# Patient Record
Sex: Male | Born: 1965 | Race: White | Hispanic: No | Marital: Married | Smoking: Never smoker
Health system: Southern US, Community
[De-identification: ages and names within clinical notes are randomized; demographics above are authoritative.]

## PROBLEM LIST (undated history)

## (undated) DIAGNOSIS — R21 Rash and other nonspecific skin eruption: Secondary | ICD-10-CM

## (undated) DIAGNOSIS — F909 Attention-deficit hyperactivity disorder, unspecified type: Secondary | ICD-10-CM

## (undated) DIAGNOSIS — J45909 Unspecified asthma, uncomplicated: Secondary | ICD-10-CM

## (undated) DIAGNOSIS — M5136 Other intervertebral disc degeneration, lumbar region: Secondary | ICD-10-CM

## (undated) DIAGNOSIS — E291 Testicular hypofunction: Secondary | ICD-10-CM

## (undated) DIAGNOSIS — F419 Anxiety disorder, unspecified: Secondary | ICD-10-CM

## (undated) DIAGNOSIS — M51369 Other intervertebral disc degeneration, lumbar region without mention of lumbar back pain or lower extremity pain: Secondary | ICD-10-CM

## (undated) DIAGNOSIS — A4902 Methicillin resistant Staphylococcus aureus infection, unspecified site: Secondary | ICD-10-CM

## (undated) DIAGNOSIS — K219 Gastro-esophageal reflux disease without esophagitis: Secondary | ICD-10-CM

## (undated) DIAGNOSIS — J302 Other seasonal allergic rhinitis: Secondary | ICD-10-CM

## (undated) HISTORY — PX: CARPAL TUNNEL RELEASE: SHX101

## (undated) HISTORY — PX: ULNAR TUNNEL RELEASE: SHX820

## (undated) HISTORY — DX: Testicular hypofunction: E29.1

## (undated) HISTORY — DX: Other seasonal allergic rhinitis: J30.2

## (undated) HISTORY — DX: Anxiety disorder, unspecified: F41.9

## (undated) HISTORY — DX: Methicillin resistant Staphylococcus aureus infection, unspecified site: A49.02

## (undated) HISTORY — DX: Unspecified asthma, uncomplicated: J45.909

## (undated) HISTORY — DX: Other intervertebral disc degeneration, lumbar region: M51.36

## (undated) HISTORY — DX: Rash and other nonspecific skin eruption: R21

## (undated) HISTORY — DX: Other intervertebral disc degeneration, lumbar region without mention of lumbar back pain or lower extremity pain: M51.369

## (undated) HISTORY — DX: Attention-deficit hyperactivity disorder, unspecified type: F90.9

## (undated) HISTORY — DX: Gastro-esophageal reflux disease without esophagitis: K21.9

---

## 2003-08-06 ENCOUNTER — Encounter: Admission: RE | Admit: 2003-08-06 | Discharge: 2003-08-06 | Payer: Self-pay | Admitting: Orthopedic Surgery

## 2005-07-29 ENCOUNTER — Encounter: Admission: RE | Admit: 2005-07-29 | Discharge: 2005-07-29 | Payer: Self-pay | Admitting: Orthopedic Surgery

## 2006-07-12 ENCOUNTER — Inpatient Hospital Stay (HOSPITAL_COMMUNITY): Admission: EM | Admit: 2006-07-12 | Discharge: 2006-07-24 | Payer: Self-pay | Admitting: Orthopedic Surgery

## 2006-07-16 ENCOUNTER — Ambulatory Visit: Payer: Self-pay | Admitting: Internal Medicine

## 2006-08-20 ENCOUNTER — Ambulatory Visit: Payer: Self-pay | Admitting: Infectious Diseases

## 2006-12-12 ENCOUNTER — Telehealth: Payer: Self-pay | Admitting: Infectious Diseases

## 2006-12-13 DIAGNOSIS — Z8739 Personal history of other diseases of the musculoskeletal system and connective tissue: Secondary | ICD-10-CM | POA: Insufficient documentation

## 2007-02-23 ENCOUNTER — Emergency Department (HOSPITAL_COMMUNITY): Admission: EM | Admit: 2007-02-23 | Discharge: 2007-02-23 | Payer: Self-pay | Admitting: Emergency Medicine

## 2007-05-23 ENCOUNTER — Telehealth: Payer: Self-pay

## 2009-04-13 IMAGING — CR DG FEMUR 2V*L*
4 series · 4 of 4 positions shown · non-contrast
Comparison: none

CLINICAL DATA: Left thigh laceration secondary to chain saw injury.
 LEFT FEMUR - 4 VIEW:

[view not recorded (1 of 4)]
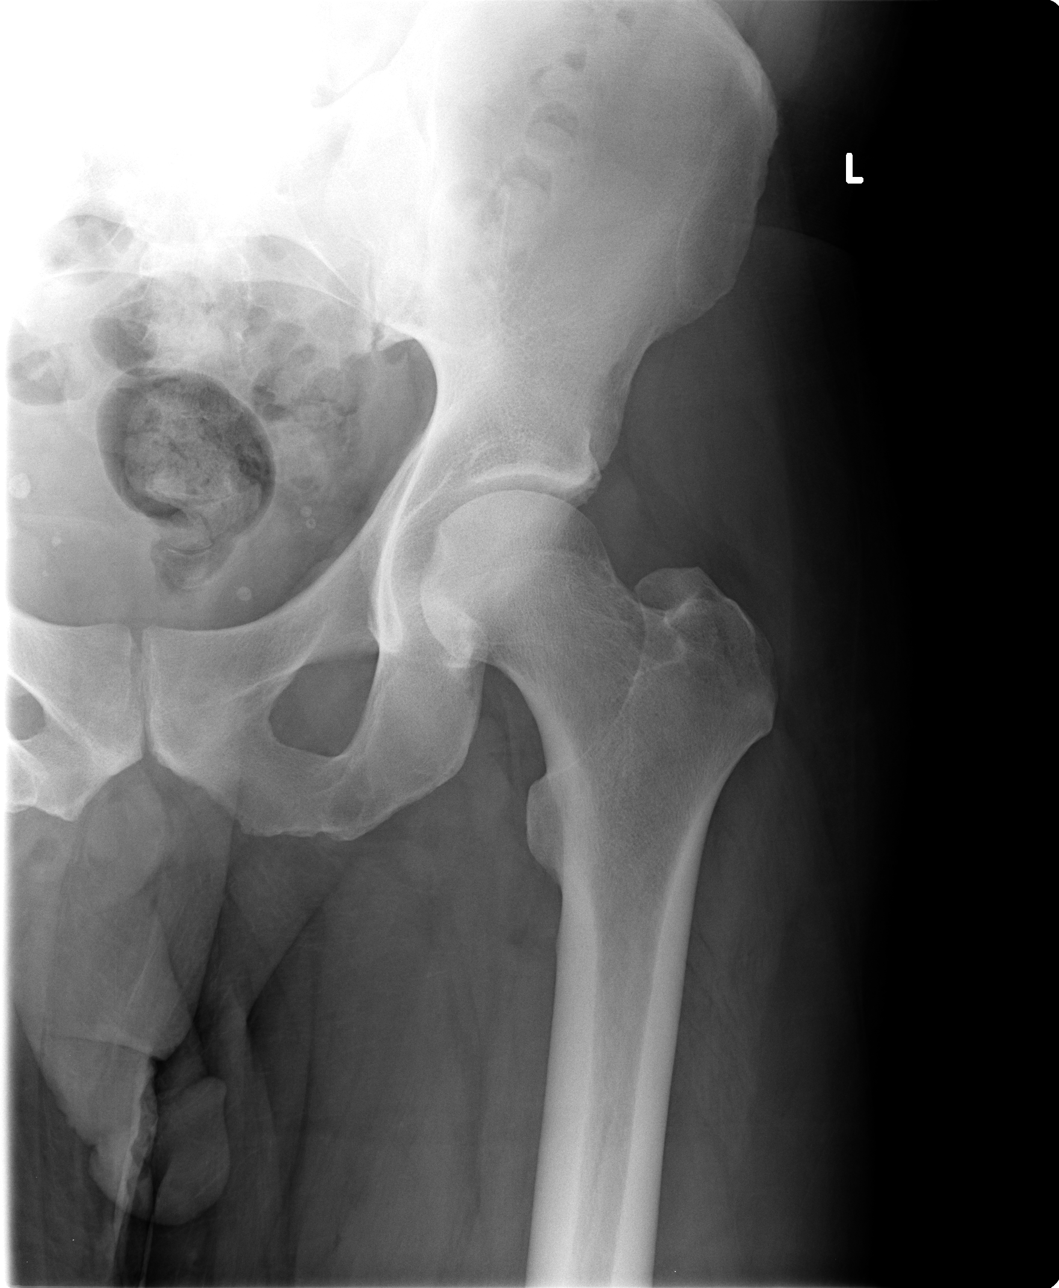

[view not recorded (2 of 4)]
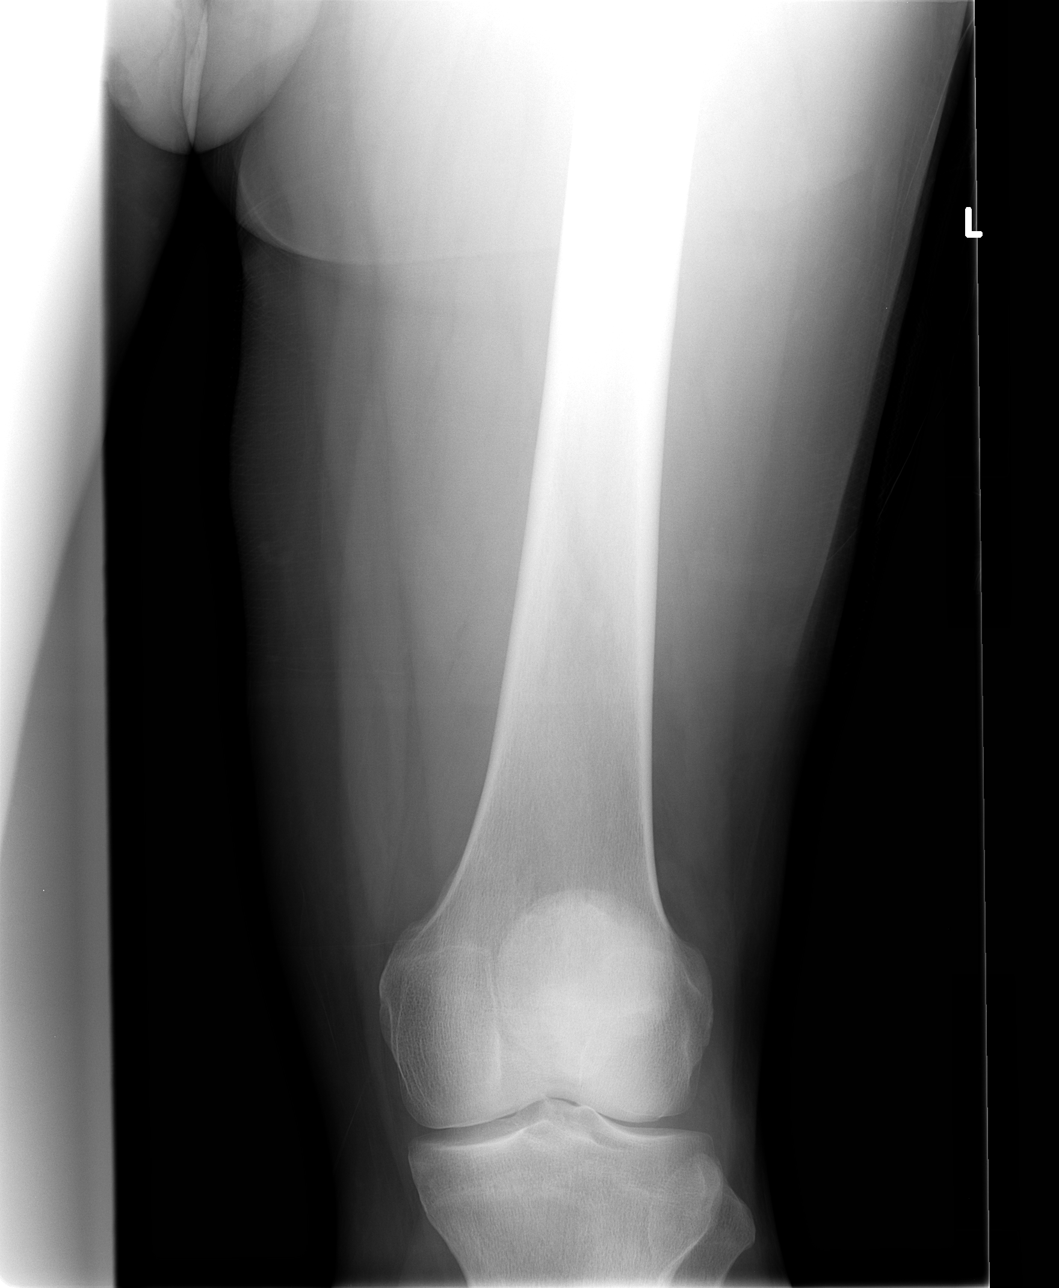

[view not recorded (3 of 4)]
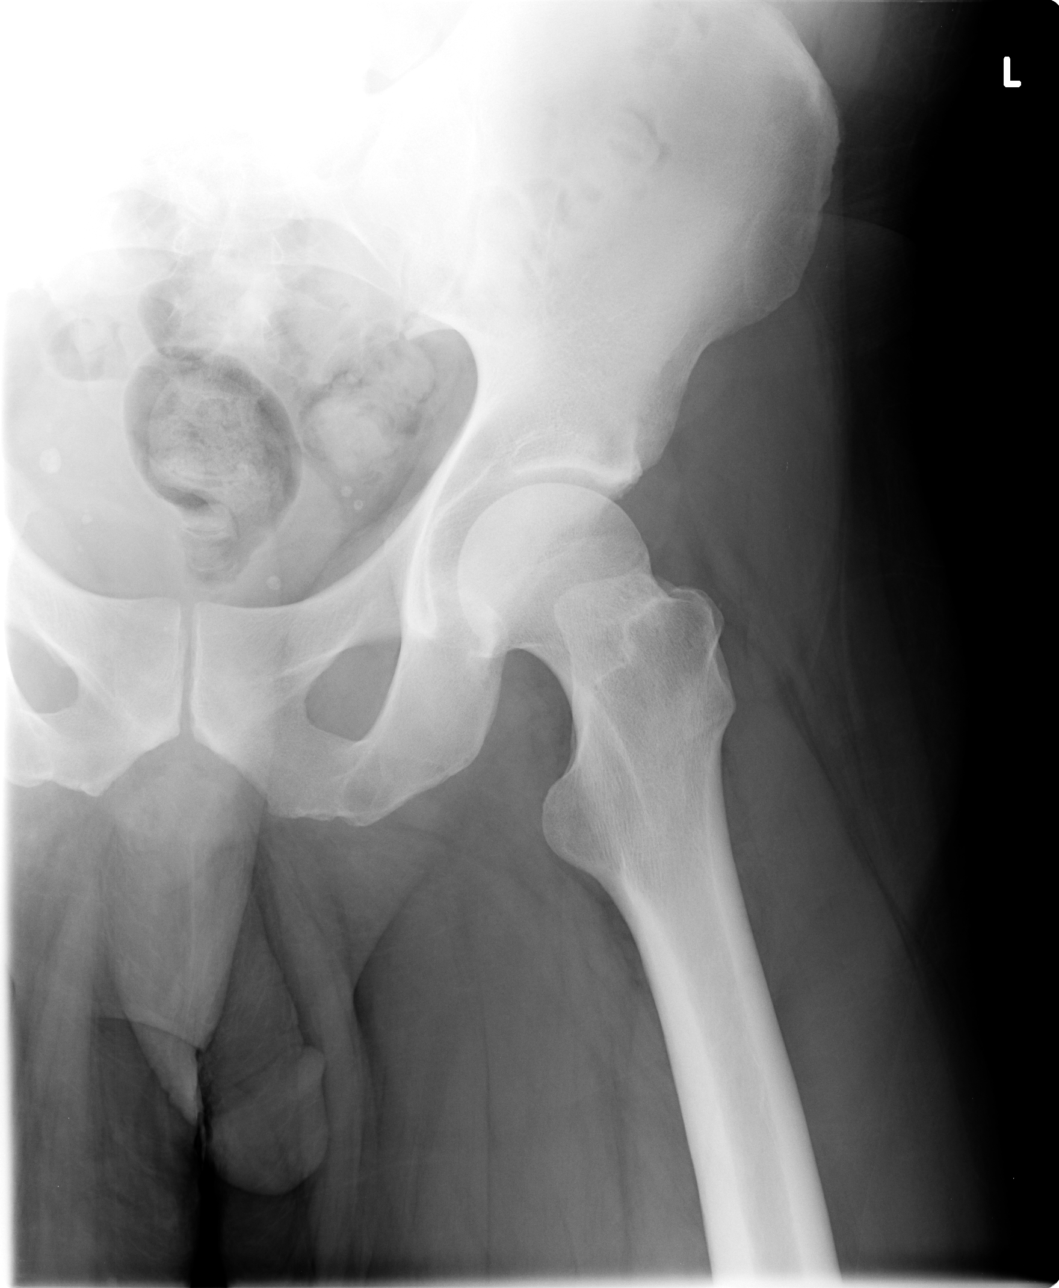

[view not recorded (4 of 4)]
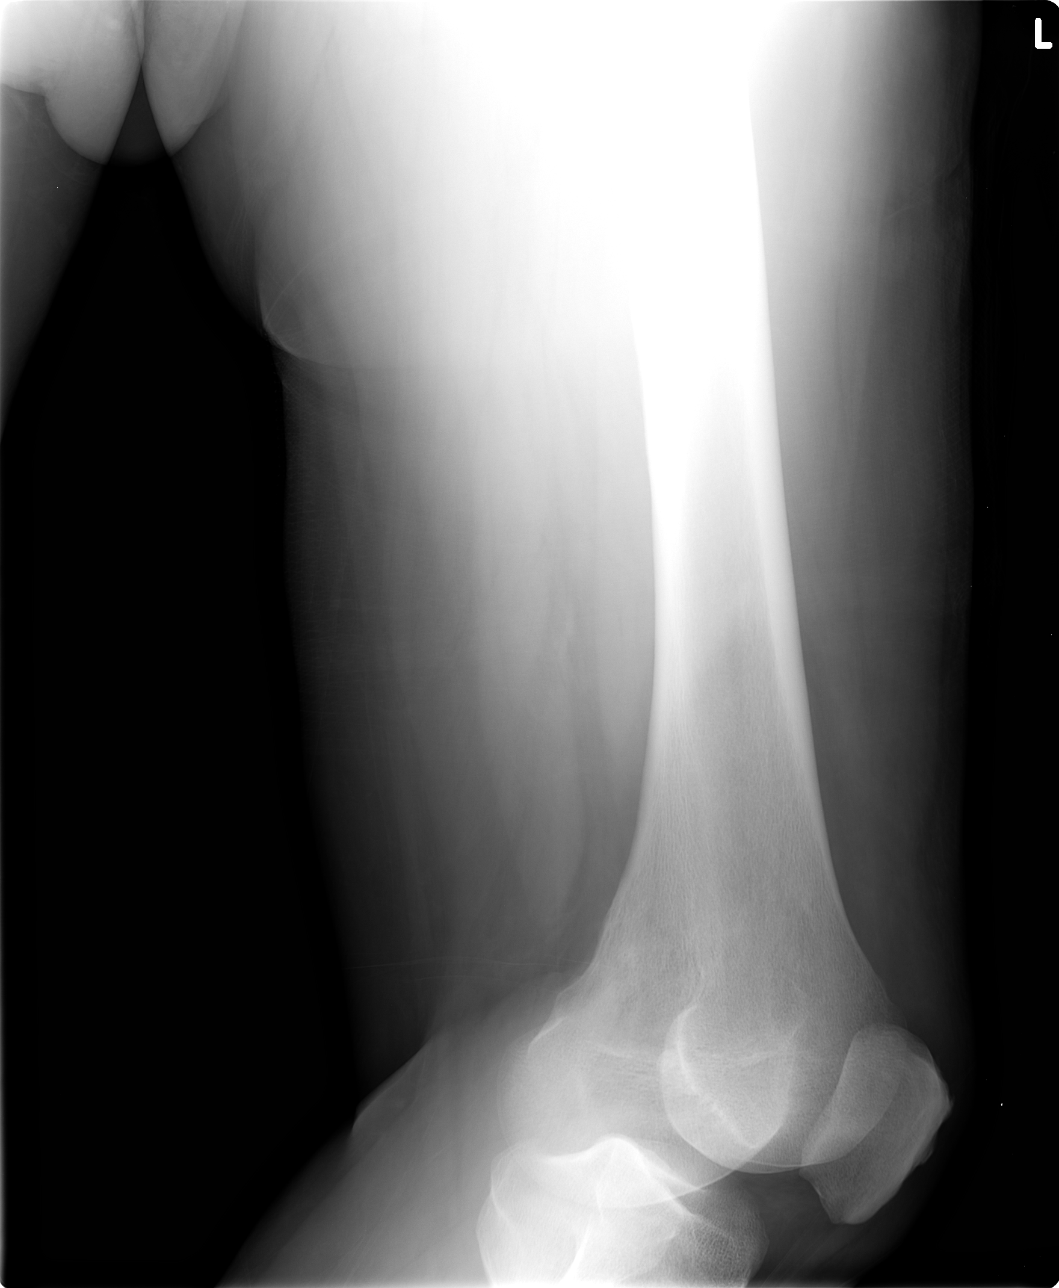

[4 of 4 positions shown; findings below may reference images not displayed]

FINDINGS: No acute bony abnormality. No radiopaque foreign body.
IMPRESSION: No acute bony abnormality.

## 2009-06-14 DIAGNOSIS — A4902 Methicillin resistant Staphylococcus aureus infection, unspecified site: Secondary | ICD-10-CM

## 2009-06-14 HISTORY — PX: PERIPHERALLY INSERTED CENTRAL CATHETER INSERTION: SHX2221

## 2009-06-14 HISTORY — DX: Methicillin resistant Staphylococcus aureus infection, unspecified site: A49.02

## 2010-09-03 ENCOUNTER — Encounter: Payer: Self-pay | Admitting: Orthopedic Surgery

## 2010-12-30 NOTE — Discharge Summary (Signed)
NAMEBRISTOL, SOY NO.:  [id number]   MEDICAL RECORD NO.:  [id number]          PATIENT TYPE:  INP   LOCATION:  1611                         FACILITY:  [facility]   PHYSICIAN:  Marlowe Kays, M.D.  DATE OF BIRTH:  1965/09/23   DATE OF ADMISSION:  07/12/2006  DATE OF DISCHARGE:  07/24/2006                               DISCHARGE SUMMARY   ADMITTING DIAGNOSES:  Cellulitis and prepatellar bursitis, right knee.   DISCHARGE DIAGNOSIS:  Cellulitis and prepatellar bursitis, right knee.   CONSULT:  Dr. Johny Sax, infectious disease.   OPERATION:  None.   PROCEDURE:  1. Aspiration of right prepatellar bursitis with cultures at bedside.  2. Insertion of PICC line for home antibiotic therapy.   BRIEF HISTORY:  This 45 year old white male presented to the office in  the courtesy of referral by Dr. Jeannie Fend of [facility] in [place].  The patient gives a  history of working on his knees on and off with blunt knee trauma.  Also  recalls a situation where he was working on a washing machine that had  raw sewage backed up into it, and this spilled on his right lower  extremity.  Also through Dr. Tinnie Gens Hatcher's close questioning, he did  have high suspicion of a spider bite to his right foot in the not too  distant past as he found a spider in his boot.   When patient presented to the office, he had a very angry prepatellar  bursitis to his right knee with surrounding cellulitis to the anterior  knee.  This was exquisitely tender to palpation and touch and interfered  with flexion and extension of the knee.  ____________ was about 20  degrees.  He was able to get about, but the lack of range of motion in  the knee secondary to pain caused a limp to the right.   In the office, the prepatellar bursa was aspirated.  The joint was not  entered.  These were sent for cultures and showed a negative gram stain  as  well as no growth.  The patient returned after being on Keflex p.o.  q.i.d.  He was noted to have continuing erythema and increased pain.  It  was decided at that time that hospitalization with IV antibiotic therapy  would be indicated as well as rest and constant heat.  He therefore was  admitted to [facility].   COURSE IN THE HOSPITAL:  The patient was placed at bedrest.  IV Ancef  was given early on which caused about a 30% improvement to the  cellulitis as well as the pain.  It was hoped that this may be on the  right course.  Unfortunately, his pain increased.  We asked for  infectious disease consult by Dr. Tinnie Gens C. Hatcher, and he recommended  that we change over to vancomycin.  It was highly suspect that this was  indicative of a MRSA-type infection, particularly with history of MRSA  in the hospital.  We had extreme difficulty with pain control.  He was  on and off of PCA Dilaudid for pain as well as p.o..  As he responded to  the vancomycin, his pain decreased, and we eventually were able to get  him on p.o. analgesics.   The erythema resolved somewhat around the anterior knee with the primary  area being in the patellar region.  Continued with warmth and  tenderness, with overall the cellulitis was better.   Aspiration was done at bedside with reculturing of the patella bursa.  This again showed negative on gram stain, no epithelial cells were seen  and negative for any growth after two days.  Dr. Ninetta Lights felt it was  imperative that we continue with the patient's antibiotic therapy,  particularly in light of the fact of the suspect of a prepatellar MRSA  infection.  Arrangements were made to [facility] for him to have IV  vancomycin 2 g b.i.d. for 2 weeks.  PICC line was arranged for him to  have home IV antibiotic therapy.  Hopefully, we will be able to maintain  this gentleman in his home with high concern for pain control at [facility]  time.  Dr. Sedonia Small and I  have discussed at bedside the medication and  plan to the patient in detail.  Laboratory values in the hospital  hematologically showed admission CBC with an elevated white count of  10.3, hemoglobin was 12.7, hematocrit 36.1.  Urinalysis was negative for  urinary tract infection.  The blood chemistries were all normal.  Vancomycin trough was 18.9 on July 18, 2006.  Cultures done on  July 18, 2006 from the right prepatellar showed no WBCs, no squamous  epithelial cells seen, no organisms seen and no growth x2 days with the  final indication.  No x-rays were taken.  Electrocardiogram was not  done.   CONDITION ON DISCHARGE:  Improved, stable.   PLAN:  The patient discharged to his home to continue with IV antibiotic  therapy using vancomycin 2 g b.i.d. under the direction of [facility].  He will need vancomycin troughs per protocol.  For pain,  we will give him Dilaudid 2 mg 1 to 2 q.4-6h. p.r.n. pain.  Hopefully,  we can come down to medication that is not quite as strong in the not  too distant future.  Also,  as an anti-inflammatory, Mobic 7.5 one  b.i.d. p.r.n. is given.  Vancomycin 2 g b.i.d. for 2 weeks IV as  mentioned and an 81-mg aspirin tablet daily.  I have also ordered a K-  pad for continuous moist heat to the right knee.  He may weight bear as  tolerated.  Use dry dressing to the knee as needed.  There is a small  amount of drainage at the time of discharge to the 4x4, and we have  recommend to keep it covered.  He is encouraged to call should he have  any problems or questions.  We will also have him follow-up in the  office in 1 week.  We will check him at least weekly.  All questions  were encouraged and answered at the time of discharge, and he is  encouraged to call us should he have any problems or questions.      Dooley L. Cherlynn June.    ______________________________  Marlowe Kays, M.D.   DLU/MEDQ  D:  07/24/2006  T:  07/24/2006   Job:  [id number]   cc:   Lacretia Leigh. Ninetta Lights, M.D.  Fax: [phone]   Paul Dykes. Grant Ruts.,  M.D.  Fax: [phone]   [facility]

## 2010-12-30 NOTE — H&P (Signed)
NAME:  Colin Sutton, Colin Sutton NO.:  [id number]   MEDICAL RECORD NO.:  [id number]          PATIENT TYPE:  INP   LOCATION:  1611                         FACILITY:  [facility]   PHYSICIAN:  Marlowe Kays, M.D.  DATE OF BIRTH:  1966-03-21   DATE OF ADMISSION:  07/12/2006  DATE OF DISCHARGE:                              HISTORY & PHYSICAL   CHIEF COMPLAINT:  Pain in my right knee.   PRESENT ILLNESS:  This is a 45 year old white male seen by [place] for  problems concerning his right knee.  He was seen early on in the office  with prepatellar bursitis with pain and swelling into the prepatellar  region of the right knee.  The patient stated he has had multiple  problems with the knee with kneeling and with doing work on his knees.  He is a Restaurant manager, fast food and there is considerable amount of pressure to his  knee.  Also had exposure to raw sewage and septic drainage when working  with a washing machine that had sewage backup into it.  Was seen in the  office, the knee was aspirated and cultured.  No growth and negative  gram stain was seen.  There was some calcium phosphate crystals.  He was  seen again with increased pain, unbearable to the point where p.o.  analgesia was not helping his discomfort.  He had erythema about the  entire anterior knee, swelling, exquisite tenderness to touch.  Calf was  soft and Homans sign was negative.  The patient was to be admitted to  [facility] for IV antibiotic therapy and pain control.  No  surgery is planned at this time.   PAST MEDICAL HISTORY:  This gentleman has been in relatively good health  throughout his lifetime.   Present illness is his only major problem.  He does have bilateral  plantar fasciitis, takes Percocet for that which has helped his overall  ability to get up and about.  He had been seen in the past by Dr.  Lestine Box for that.   FAMILY HISTORY AND SOCIAL HISTORY:  Noncontributory.   CURRENT MEDICATIONS:  1.  Toradol 10 mg one tablet q.6 h p.r.n. pain.  2. Cephalexin 5 mg p.o. q.i.d. (see started the office after first      office visit).   REVIEW OF SYSTEMS:  CNS: No seizures or paralysis, numbness, double  vision.  RESPIRATORY:  No productive cough, no hemoptysis, shortness of  breath.  CARDIOVASCULAR:  No chest pain or angina, orthopnea.  GASTROINTESTINAL:  No nausea, vomiting, melena or bloody stools.  GENITOURINARY:  No discharge, dysuria.  MUSCULOSKELETAL:  Primarily as  in present illness his right knee, but also bilateral plantar fasciitis,  worse in the morning.   PHYSICAL EXAMINATION:  GENERAL:  He is an alert, cooperative, obese 63-  year-old white male who is in obvious distress.  He walks with a guarded  limp to the right.  Range of motion of right knee is markedly guarded.  HEENT:  Normocephalic.  PERRLA.  Extraocular movements intact.  Oropharynx clear.  CHEST:  Clear to  auscultation, rhonchi or rales or wheezes.  HEART:  Regular rate and rhythm.  No murmurs are heard.  ABDOMEN: Soft, nontender, liver and spleen not felt.  CARDIOVASCULAR:  Not done.  GENITALIA/RECTAL:  Not done.  Not pertinent to present illness.  EXTREMITIES:  Right knee as in present illness above.   ADMISSION DIAGNOSIS:  Cellulitis and prepatellar bursitis of the right  knee, possible infection.   PLAN:  The patient will undergo IV antibiotic therapy and pain control  with continuous heat and rest to the right knee during this  hospitalization.  Even in light of the negative cultures, this  presentation does suggest a MRSA type infection, and we will certainly  be looking at this very, very closely to see how it does responds to the  IV Ancef.      Dooley L. Cherlynn June.    ______________________________  Marlowe Kays, M.D.    DLU/MEDQ  D:  07/24/2006  T:  07/24/2006  Job:  [id number]   cc:   Remi Deter A. Grant Ruts., M.D.  Fax: [phone]   Marlowe Kays, M.D.  Fax: [phone]

## 2013-03-14 DIAGNOSIS — E291 Testicular hypofunction: Secondary | ICD-10-CM

## 2013-03-14 HISTORY — DX: Testicular hypofunction: E29.1

## 2013-03-26 ENCOUNTER — Encounter: Payer: Self-pay | Admitting: Medical

## 2013-03-26 ENCOUNTER — Ambulatory Visit
Admission: RE | Admit: 2013-03-26 | Discharge: 2013-03-26 | Disposition: A | Discharge status: Home / Self Care | Payer: BC Managed Care – PPO | Source: Ambulatory Visit | Attending: Medical | Admitting: Medical

## 2013-03-26 ENCOUNTER — Ambulatory Visit (INDEPENDENT_AMBULATORY_CARE_PROVIDER_SITE_OTHER): Payer: BC Managed Care – PPO | Admitting: Medical

## 2013-03-26 VITALS — BP 102/80 | HR 76 | Temp 97.4°F | Resp 16 | Ht 74.0 in | Wt 266.0 lb

## 2013-03-26 DIAGNOSIS — R5383 Other fatigue: Secondary | ICD-10-CM

## 2013-03-26 DIAGNOSIS — K036 Deposits [accretions] on teeth: Secondary | ICD-10-CM

## 2013-03-26 DIAGNOSIS — R0602 Shortness of breath: Secondary | ICD-10-CM

## 2013-03-26 DIAGNOSIS — Z23 Encounter for immunization: Secondary | ICD-10-CM

## 2013-03-26 DIAGNOSIS — Z569 Unspecified problems related to employment: Secondary | ICD-10-CM

## 2013-03-26 DIAGNOSIS — R6882 Decreased libido: Secondary | ICD-10-CM

## 2013-03-26 DIAGNOSIS — M25569 Pain in unspecified knee: Secondary | ICD-10-CM

## 2013-03-26 DIAGNOSIS — R112 Nausea with vomiting, unspecified: Secondary | ICD-10-CM

## 2013-03-26 DIAGNOSIS — R5381 Other malaise: Secondary | ICD-10-CM

## 2013-03-26 DIAGNOSIS — J45909 Unspecified asthma, uncomplicated: Secondary | ICD-10-CM

## 2013-03-26 DIAGNOSIS — M7652 Patellar tendinitis, left knee: Secondary | ICD-10-CM

## 2013-03-26 DIAGNOSIS — Z0000 Encounter for general adult medical examination without abnormal findings: Secondary | ICD-10-CM

## 2013-03-26 DIAGNOSIS — M7650 Patellar tendinitis, unspecified knee: Secondary | ICD-10-CM

## 2013-03-26 DIAGNOSIS — G8929 Other chronic pain: Secondary | ICD-10-CM

## 2013-03-26 DIAGNOSIS — Z579 Occupational exposure to unspecified risk factor: Secondary | ICD-10-CM

## 2013-03-26 DIAGNOSIS — R21 Rash and other nonspecific skin eruption: Secondary | ICD-10-CM

## 2013-03-26 LAB — POCT URINALYSIS DIPSTICK
Bilirubin, UA: NEGATIVE
Blood, UA: NEGATIVE
Glucose, UA: NEGATIVE
Ketones, UA: NEGATIVE
Leukocytes, UA: NEGATIVE
Nitrite, UA: NEGATIVE
Protein, UA: NEGATIVE
Spec Grav, UA: 1.02
Urobilinogen, UA: NEGATIVE
pH, UA: 5

## 2013-03-26 MED ORDER — DICLOFENAC SODIUM 1.5 % TD SOLN: 40.0000 [drp] | Freq: Two times a day (BID) | TRANSDERMAL | Status: DC

## 2013-03-26 NOTE — Progress Notes (Signed)
Subjective:   HPI  Colin Sutton is a 47 y.o. male who presents for a complete physical.  No prior routine primary care.   Medical care team includes:  Dr. Ethelene Hal, orthopedics  Dr. Evelene Croon, psychiatry   Preventative care: Last ophthalmology visit: n/a Last dental visit: n/a Last colonoscopy:n/a Last prostate exam: ? Last EKG years ago Last labs:n/a Ortho doctor- Dr. Ethelene Hal  Prior vaccinations: TD or Tdap:unsure Influenza:n/a Pneumococcal:n/a Shingles/Zostavax:n/a  Advanced directive:n/a Health care power of attorney:n/a Living will:n/a  Concerns: He notes hx/o asthma.   He is a nonsmoker.  From time to time uses mother's Atrovent.  Gets SOB and "asthma" 2-3 times each month.  He works as a Writer, cuts stone, doesn't use mask.     He notes rash, intermittent for year.  Thinks its psoriasis, gets red patches, scaling patches, mostly left ankle.  Left knee at times crunches and causes pain.  Dr. Ethelene Hal tried NSAID oral, and wrote script for Pennsaid topically, but insurance company denied, unless he was diagnosed with bursitis.  He is on his knees laying stone, does a lot of physical activity on the job.  He reports hx/o vomiting x 3 years.  Was given script for phenergan by Dr. Ethelene Hal which helped.   Once weekly gets this.  Is out in the heat doing work often, does drink a lot of water.   At times gets nausea, sometimes episodes of vomiting.   Has had persistent vomiting and nausea at least once weekly the last 3 years or more.   He notes lack of energy, fatigued, low sex drive at times in the past.   Used some of father's testim which made him feel like a teenager.   Denies problems with erections.    Reviewed their medical, surgical, family, social, medication, and allergy history and updated chart as appropriate.  Past Medical History  Diagnosis Date  . Asthma     has used mother's Atrovent prn  . Seasonal allergic rhinitis   . GERD (gastroesophageal reflux disease)    . Rash     intermittent  . MRSA infection 06/2009    [facility], 28 day hospitalization, started with abrasion to knee  . ADHD (attention deficit hyperactivity disorder)     Dr. Evelene Croon  . Anxiety   . Lumbar degenerative disc disease     Dr. Ethelene Hal, [facility]    Past Surgical History  Procedure Laterality Date  . Carpal tunnel release      bilat  . Ulnar tunnel release      right arm  . Peripherally inserted central catheter insertion  06/2009    Vancomycin for MRSA infection     History   Social History  . Marital Status: Married    Spouse Name: N/A    Number of Children: N/A  . Years of Education: N/A   Occupational History  . Not on file.   Social History Main Topics  . Smoking status: Never Smoker   . Smokeless tobacco: Not on file  . Alcohol Use: No  . Drug Use: No  . Sexual Activity: Not on file   Other Topics Concern  . 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   Single.   Has 2 chlidren.  Christian faith.     Family History  Problem Relation Age of Onset  . COPD Mother   . Diabetes Mother   .  Heart disease Father 38  . Diabetes Father   . Asthma Brother   . Cancer Neg Hx   . Stroke Neg Hx     Current outpatient prescriptions:ALPRAZolam (XANAX) 1 MG tablet, Take 1 mg by mouth 4 (four) times daily., Disp: , Rfl: ;  dextroamphetamine (DEXEDRINE SPANSULE) 10 MG 24 hr capsule, Take 10 mg by mouth 6 (six) times daily., Disp: , Rfl: ;  mirtazapine (REMERON) 15 MG tablet, Take 15 mg by mouth at bedtime., Disp: , Rfl:  oxyCODONE (ROXICODONE) 15 MG immediate release tablet, Take 15 mg by mouth every 4 (four) hours as needed for pain., Disp: , Rfl:   Allergies  Allergen Reactions  . Acetaminophen     GI upset, diarrhea  . Morphine And Related     Sick, dizzy, nausea, vomiting   Review of Systems Constitutional: -fever, -chills, -sweats, -unexpected weight change, -decreased  appetite, -fatigue Allergy: -sneezing, -itching, -congestion Dermatology: -changing moles, +rash, -lumps ENT: -runny nose, -ear pain, -sore throat, -hoarseness, -sinus pain, -teeth pain, - ringing in ears, -hearing loss, -nosebleeds Cardiology: -chest pain, -palpitations, -swelling, -difficulty breathing when lying flat, -waking up short of breath Respiratory: -cough, -shortness of breath, -difficulty breathing with exercise or exertion, -wheezing, -coughing up blood Gastroenterology: -abdominal pain, -nausea, +vomiting, -diarrhea, -constipation, -blood in stool, -changes in bowel movement, -difficulty swallowing or eating Hematology: -bleeding, -bruising  Musculoskeletal: -joint aches, -muscle aches, -joint swelling, -back pain, -neck pain, -cramping, -changes in gait Ophthalmology: denies vision changes, eye redness, itching, discharge Urology: -burning with urination, -difficulty urinating, -blood in urine, -urinary frequency, -urgency, -incontinence Neurology: -headache, -weakness, -tingling, -numbness, -memory loss, -falls, -dizziness Psychology: -depressed mood, -agitation, -sleep problems     Objective:   Physical Exam  Filed Vitals:   03/26/13 0931  BP: 102/80  Pulse: 76  Temp: 97.4 F (36.3 C)  Resp: 16    General appearance: alert, no distress, WD/WN, overweight white male Skin: left upper anterior thigh with horizontal scar, right forehead above eyebrow with vertical scar, few scattered macules, right lateral buttock with large pedunculated skin tag, no worrisome lesions HEENT: normocephalic, conjunctiva/corneas normal, sclerae anicteric, PERRLA, EOMi, nares patent, no discharge or erythema, pharynx normal Oral cavity: MMM, tongue normal, teeth with significant plaque Neck: supple, no lymphadenopathy, no thyromegaly, no masses, normal ROM, no bruits Chest: non tender, normal shape and expansion Heart: RRR, normal S1, S2, no murmurs Lungs: CTA bilaterally, no wheezes,  rhonchi, or rales Abdomen: +bs, soft, non tender, non distended, no masses, no hepatomegaly, no splenomegaly, no bruits Back: non tender, normal ROM, no scoliosis Musculoskeletal: bilat knees with crepitus, left patellar tendon tender, tender medial knee of left leg, mild pain with mcmurays, but no clicks, no laxity, otherwise upper extremities non tender, no obvious deformity, normal ROM throughout, lower extremities non tender, no obvious deformity, normal ROM throughout Extremities: no edema, no cyanosis, no clubbing Pulses: 2+ symmetric, upper and lower extremities, normal cap refill Neurological: alert, oriented x 3, CN2-12 intact, strength normal upper extremities and lower extremities, sensation normal throughout, DTRs 2+ throughout, no cerebellar signs, gait normal Psychiatric: normal affect, behavior normal, pleasant  GU: normal male external genitalia, uncircumcised, bilat varicoceles, nontender, no masses, no hernia, no lymphadenopathy Rectal: anus normal tone, prostate WNL, no nodules, occult negative stool   Assessment and Plan :      Encounter Diagnoses  Name Primary?  . Routine general medical examination at a health care facility Yes  . Unspecified asthma(493.90)   . SOB (shortness of breath)   .  Occupational exposure in workplace   . Other malaise and fatigue   . Rash and nonspecific skin eruption   . Chronic knee pain, left   . Patellar tendonitis, left   . Nausea with vomiting   . Libido, decreased   . Dental plaque   . Need for Tdap vaccination    Physical exam - discussed healthy lifestyle, diet, exercise, preventative care, vaccinations, and addressed their concerns.  Tdap vaccine, VIS and counseling given.  Specific recommendations today include:  See dentist soon for moderate plaque and general dental evaluation  See eye doctor for routine screening for glaucoma and vision and retina problems  Asthma/shortness of breath - we will refer you for chest xray  and pulmonary function studies for further evaluation  We are checking your male hormone level as well as other tests in regards to your fatigue and persistent nausea  Pending labs, I will likely refer you to gastroenterology given the persistent nausea, vomiting, and history of GERD  We updated your Tdap today, this vaccine is good for 10 years  Knee pain - seems to be a combination of patellar tendonitis, patellofemoral syndrome and likely some inflammation.  Use daily Glucosamine Chondroitin OTC, and begin Pennsaid topically twice daily for relief.  Follow up: pending labs, studies

## 2013-03-26 NOTE — Patient Instructions (Addendum)
Thank you for giving me the opportunity to serve you today.    Your diagnosis today includes: Encounter Diagnoses  Name Primary?  . Routine general medical examination at a health care facility Yes  . Unspecified asthma(493.90)   . SOB (shortness of breath)   . Occupational exposure in workplace   . Other malaise and fatigue   . Rash and nonspecific skin eruption   . Chronic knee pain, left   . Patellar tendonitis, left   . Nausea with vomiting   . Libido, decreased   . Dental plaque   . Need for Tdap vaccination      Specific recommendations today include:  See dentist soon for moderate plaque and general dental evaluation  See eye doctor for routine screening for glaucoma and vision and retina problems  Asthma/shortness of breath - we will refer you for chest xray and pulmonary function studies for further evaluation  We are checking your male hormone level as well as other tests in regards to your fatigue and persistent nausea  Pending labs, I will likely refer you to gastroenterology given the persistent nausea, vomiting, and history of GERD  We updated your Tdap today, this vaccine is good for 10 years  Knee pain - seems to be a combination of patellar tendonitis, patellofemoral syndrome and likely some inflammation.  Use daily Glucosamine Chondroitin OTC, and begin Pennsaid topically twice daily for relief.  Follow up: pending labs, studies   I have included other useful information below for your review.  Knee Pain The knee is the complex joint between your thigh and your lower leg. It is made up of bones, tendons, ligaments, and cartilage. The bones that make up the knee are:  The femur in the thigh.  The tibia and fibula in the lower leg.  The patella or kneecap riding in the groove on the lower femur. CAUSES  Knee pain is a common complaint with many causes. A few of these causes are:  Injury, such as:  A ruptured ligament or tendon injury.  Torn  cartilage.  Medical conditions, such as:  Gout  Arthritis  Infections  Overuse, over training or overdoing a physical activity. Knee pain can be minor or severe. Knee pain can accompany debilitating injury. Minor knee problems often respond well to self-care measures or get well on their own. More serious injuries may need medical intervention or even surgery. SYMPTOMS The knee is complex. Symptoms of knee problems can vary widely. Some of the problems are:  Pain with movement and weight bearing.  Swelling and tenderness.  Buckling of the knee.  Inability to straighten or extend your knee.  Your knee locks and you cannot straighten it.  Warmth and redness with pain and fever.  Deformity or dislocation of the kneecap. DIAGNOSIS  Determining what is wrong may be very straight forward such as when there is an injury. It can also be challenging because of the complexity of the knee. Tests to make a diagnosis may include:  Your caregiver taking a history and doing a physical exam.  Routine X-rays can be used to rule out other problems. X-rays will not reveal a cartilage tear. Some injuries of the knee can be diagnosed by:  Arthroscopy a surgical technique by which a small video camera is inserted through tiny incisions on the sides of the knee. This procedure is used to examine and repair internal knee joint problems. Tiny instruments can be used during arthroscopy to repair the torn knee cartilage (meniscus).  Arthrography is a radiology technique. A contrast liquid is directly injected into the knee joint. Internal structures of the knee joint then become visible on X-ray film.  An MRI scan is a non x-ray radiology procedure in which magnetic fields and a computer produce two- or three-dimensional images of the inside of the knee. Cartilage tears are often visible using an MRI scanner. MRI scans have largely replaced arthrography in diagnosing cartilage tears of the knee.  Blood  work.  Examination of the fluid that helps to lubricate the knee joint (synovial fluid). This is done by taking a sample out using a needle and a syringe. TREATMENT The treatment of knee problems depends on the cause. Some of these treatments are:  Depending on the injury, proper casting, splinting, surgery or physical therapy care will be needed.  Give yourself adequate recovery time. Do not overuse your joints. If you begin to get sore during workout routines, back off. Slow down or do fewer repetitions.  For repetitive activities such as cycling or running, maintain your strength and nutrition.  Alternate muscle groups. For example if you are a weight lifter, work the upper body on one day and the lower body the next.  Either tight or weak muscles do not give the proper support for your knee. Tight or weak muscles do not absorb the stress placed on the knee joint. Keep the muscles surrounding the knee strong.  Take care of mechanical problems.  If you have flat feet, orthotics or special shoes may help. See your caregiver if you need help.  Arch supports, sometimes with wedges on the inner or outer aspect of the heel, can help. These can shift pressure away from the side of the knee most bothered by osteoarthritis.  A brace called an "unloader" brace also may be used to help ease the pressure on the most arthritic side of the knee.  If your caregiver has prescribed crutches, braces, wraps or ice, use as directed. The acronym for this is PRICE. This means protection, rest, ice, compression and elevation.  Nonsteroidal anti-inflammatory drugs (NSAID's), can help relieve pain. But if taken immediately after an injury, they may actually increase swelling. Take NSAID's with food in your stomach. Stop them if you develop stomach problems. Do not take these if you have a history of ulcers, stomach pain or bleeding from the bowel. Do not take without your caregiver's approval if you have  problems with fluid retention, heart failure, or kidney problems.  For ongoing knee problems, physical therapy may be helpful.  Glucosamine and chondroitin are over-the-counter dietary supplements. Both may help relieve the pain of osteoarthritis in the knee. These medicines are different from the usual anti-inflammatory drugs. Glucosamine may decrease the rate of cartilage destruction.  Injections of a corticosteroid drug into your knee joint may help reduce the symptoms of an arthritis flare-up. They may provide pain relief that lasts a few months. You may have to wait a few months between injections. The injections do have a small increased risk of infection, water retention and elevated blood sugar levels.  Hyaluronic acid injected into damaged joints may ease pain and provide lubrication. These injections may work by reducing inflammation. A series of shots may give relief for as long as 6 months.  Topical painkillers. Applying certain ointments to your skin may help relieve the pain and stiffness of osteoarthritis. Ask your pharmacist for suggestions. Many over the-counter products are approved for temporary relief of arthritis pain.  In some countries, doctors  often prescribe topical NSAID's for relief of chronic conditions such as arthritis and tendinitis. A review of treatment with NSAID creams found that they worked as well as oral medications but without the serious side effects. PREVENTION  Maintain a healthy weight. Extra pounds put more strain on your joints.  Get strong, stay limber. Weak muscles are a common cause of knee injuries. Stretching is important. Include flexibility exercises in your workouts.  Be smart about exercise. If you have osteoarthritis, chronic knee pain or recurring injuries, you may need to change the way you exercise. This does not mean you have to stop being active. If your knees ache after jogging or playing basketball, consider switching to swimming, water  aerobics or other low-impact activities, at least for a few days a week. Sometimes limiting high-impact activities will provide relief.  Make sure your shoes fit well. Choose footwear that is right for your sport.  Protect your knees. Use the proper gear for knee-sensitive activities. Use kneepads when playing volleyball or laying carpet. Buckle your seat belt every time you drive. Most shattered kneecaps occur in car accidents.  Rest when you are tired. SEEK MEDICAL CARE IF:  You have knee pain that is continual and does not seem to be getting better.  SEEK IMMEDIATE MEDICAL CARE IF:  Your knee joint feels hot to the touch and you have a high fever. MAKE SURE YOU:   Understand these instructions.  Will watch your condition.  Will get help right away if you are not doing well or get worse. Document Released: 05/28/2007 Document Revised: 10/23/2011 Document Reviewed: 05/28/2007 [facility] Patient Information 2014 [place], [place].     Dr. Yancey Flemings, dentist [address], [place], [place] [address] [phone] [email or web address]    Ophthalmology Dr. Glenford Peers [address] Felipa Emory [place], [place] [address] [phone]   [facility] Dr. Gelene Mink [address], [place] [place], [place] [address]  [phone] [email or web address]   Vincenza Hews, M.D. Susanne Greenhouse, O.D. [address] [place], [place] [address] Medical telephone: [phone] Optical telephone: [phone]

## 2013-03-26 NOTE — Addendum Note (Signed)
Addended by: Janeice Robinson on: 03/26/2013 10:44 AM   Modules accepted: Orders

## 2013-03-27 LAB — CBC WITH DIFFERENTIAL/PLATELET
Basophils Absolute: 0.1 10*3/uL (ref 0.0–0.1)
Basophils Relative: 2 % — ABNORMAL HIGH (ref 0–1)
Eosinophils Absolute: 0.6 10*3/uL (ref 0.0–0.7)
Eosinophils Relative: 12 % — ABNORMAL HIGH (ref 0–5)
HCT: 38.4 % — ABNORMAL LOW (ref 39.0–52.0)
Hemoglobin: 12.7 g/dL — ABNORMAL LOW (ref 13.0–17.0)
Lymphocytes Relative: 34 % (ref 12–46)
Lymphs Abs: 1.9 10*3/uL (ref 0.7–4.0)
MCH: 27.4 pg (ref 26.0–34.0)
MCHC: 33.1 g/dL (ref 30.0–36.0)
MCV: 82.9 fL (ref 78.0–100.0)
Monocytes Absolute: 0.6 10*3/uL (ref 0.1–1.0)
Monocytes Relative: 11 % (ref 3–12)
Neutro Abs: 2.2 10*3/uL (ref 1.7–7.7)
Neutrophils Relative %: 41 % — ABNORMAL LOW (ref 43–77)
Platelets: 304 10*3/uL (ref 150–400)
RBC: 4.63 MIL/uL (ref 4.22–5.81)
RDW: 14.1 % (ref 11.5–15.5)
WBC: 5.5 10*3/uL (ref 4.0–10.5)

## 2013-03-27 LAB — LIPID PANEL
Cholesterol: 192 mg/dL (ref 0–200)
HDL: 44 mg/dL (ref >39)
LDL Cholesterol: 128 mg/dL — ABNORMAL HIGH (ref 0–99)
Total CHOL/HDL Ratio: 4.4 Ratio
Triglycerides: 99 mg/dL (ref <150)
VLDL: 20 mg/dL (ref 0–40)

## 2013-03-27 LAB — COMPREHENSIVE METABOLIC PANEL
ALT: 14 U/L (ref 0–53)
AST: 17 U/L (ref 0–37)
Albumin: 4.6 g/dL (ref 3.5–5.2)
Alkaline Phosphatase: 57 U/L (ref 39–117)
BUN: 17 mg/dL (ref 6–23)
CO2: 28 mEq/L (ref 19–32)
Calcium: 9.3 mg/dL (ref 8.4–10.5)
Chloride: 103 mEq/L (ref 96–112)
Creat: 1.33 mg/dL (ref 0.50–1.35)
Glucose, Bld: 86 mg/dL (ref 70–99)
Potassium: 4.4 mEq/L (ref 3.5–5.3)
Sodium: 138 mEq/L (ref 135–145)
Total Bilirubin: 0.7 mg/dL (ref 0.3–1.2)
Total Protein: 7 g/dL (ref 6.0–8.3)

## 2013-03-27 LAB — SEDIMENTATION RATE: Sed Rate: 12 mm/hr (ref 0–16)

## 2013-03-27 LAB — TESTOSTERONE: Testosterone: 295 ng/dL — ABNORMAL LOW (ref 300–890)

## 2013-03-27 LAB — TSH: TSH: 2.068 u[IU]/mL (ref 0.350–4.500)

## 2013-03-27 LAB — PSA: PSA: 0.9 ng/mL (ref <=4.00)

## 2013-04-02 NOTE — Progress Notes (Signed)
PT called and I advised of labs results.  Pt has had chest xray and has PFT scheduled for next week.  Will you be giving him something for the low testosterone?

## 2013-04-08 ENCOUNTER — Ambulatory Visit (INDEPENDENT_AMBULATORY_CARE_PROVIDER_SITE_OTHER): Payer: BC Managed Care – PPO | Admitting: Internal Medicine

## 2013-04-08 DIAGNOSIS — R0602 Shortness of breath: Secondary | ICD-10-CM

## 2013-04-08 LAB — PULMONARY FUNCTION TEST

## 2013-04-08 NOTE — Progress Notes (Signed)
PFT done today. 

## 2013-04-11 ENCOUNTER — Other Ambulatory Visit: Payer: BC Managed Care – PPO

## 2013-04-11 ENCOUNTER — Telehealth: Payer: Self-pay | Admitting: Family Medicine

## 2013-04-11 DIAGNOSIS — R7989 Other specified abnormal findings of blood chemistry: Secondary | ICD-10-CM

## 2013-04-11 NOTE — Telephone Encounter (Signed)
Patient is aware of his labs, cxr results and PFT results. CLS

## 2013-04-11 NOTE — Telephone Encounter (Signed)
Message copied by Janeice Robinson on Fri Apr 11, 2013  2:57 PM ------      Message from: Jac Canavan      Created: Fri Apr 11, 2013  2:46 PM       His PFT/lung functions studies were basically normal.              Specific recommendations related to his recent visit:            See dentist soon for moderate plaque and general dental evaluation            See eye doctor for routine screening for glaucoma and vision and retina problems            Male hormone was low.  Have him return soon to review labs in depth, to repeat testosterone lab as well.  Preferably an afternoon appointment.            Given his history of persistent nausea and vomiting, I would recommend referral to gastroenterology.            Knee pain - seems to be a combination of patellar tendonitis, patellofemoral syndrome and likely some inflammation.  Use daily Glucosamine Chondroitin OTC, and begin Pennsaid topically twice daily for relief ------

## 2013-04-12 LAB — TESTOSTERONE: Testosterone: 144 ng/dL — ABNORMAL LOW (ref 300–890)

## 2013-04-18 ENCOUNTER — Telehealth: Payer: Self-pay | Admitting: Family Medicine

## 2013-04-18 ENCOUNTER — Ambulatory Visit (INDEPENDENT_AMBULATORY_CARE_PROVIDER_SITE_OTHER): Payer: BC Managed Care – PPO | Admitting: Medical

## 2013-04-18 ENCOUNTER — Encounter: Payer: Self-pay | Admitting: Medical

## 2013-04-18 ENCOUNTER — Telehealth: Payer: Self-pay | Admitting: Medical

## 2013-04-18 DIAGNOSIS — D649 Anemia, unspecified: Secondary | ICD-10-CM

## 2013-04-18 DIAGNOSIS — E291 Testicular hypofunction: Secondary | ICD-10-CM

## 2013-04-18 DIAGNOSIS — R0602 Shortness of breath: Secondary | ICD-10-CM

## 2013-04-18 DIAGNOSIS — L309 Dermatitis, unspecified: Secondary | ICD-10-CM

## 2013-04-18 DIAGNOSIS — L259 Unspecified contact dermatitis, unspecified cause: Secondary | ICD-10-CM

## 2013-04-18 MED ORDER — CEPHALEXIN 500 MG PO CAPS: 500.0000 mg | ORAL_CAPSULE | Freq: Two times a day (BID) | ORAL | Status: DC

## 2013-04-18 MED ORDER — TESTOSTERONE 20.25 MG/1.25GM (1.62%) TD GEL: 2.0000 | TRANSDERMAL | Status: DC

## 2013-04-18 NOTE — Telephone Encounter (Signed)
Message copied by Janeice Robinson on Fri Apr 18, 2013  2:16 PM ------      Message from: Aleen Campi, DAVID S      Created: Fri Apr 18, 2013  1:46 PM       pls refer to [facility] for headache/migraine study.  She is agreeable.   ------

## 2013-04-18 NOTE — Progress Notes (Signed)
Subjective:  Colin Sutton is a 47 y.o. male who presents for f/u on labs from recent physical visit.     He notes lack of energy, fatigued, low sex drive at times in the past.  In the past he tried a week of his father's Testim which made him feel like a teenager. Denies problems with erections.    He notes hx/o asthma. He is a nonsmoker. From time to time uses mother's Atrovent. Gets SOB occasionally, has hx/o asthma. He works as a Writer, cuts stone, doesn't use mask.   He has had no problems since I last saw him but did go for PFT.  He notes rash on right hand/palm, intermittent for year. Thinks its psoriasis, gets red patches, scaling patches, mostly left ankle.   No other aggravating or relieving factors.    No other c/o.  The following portions of the patient's history were reviewed and updated as appropriate: allergies, current medications, past family history, past medical history, past social history, past surgical history and problem list.  ROS Otherwise as in subjective above  Objective: Physical Exam  General appearance: alert, no distress, WD/WN  Assessment: Encounter Diagnoses  Name Primary?  . 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 Dermatitis   . Anemia   . SOB (shortness of breath)      Plan: Hypogonadism - discussed his recent abnormal low testosterone levels.  reviewed prior labs, additional labs today given the new diagnosis.   Discussed risks/benefits of treatment, treatment options, and begin trial of Androgel.  recheck 69mo.  Dermatitis - begin Keflex since he feels this worked in the past although questionable diagnosis, begin OTC gloves in a bottle or other thick cream or ointment for dry cracked skin.  We can consider compounded prescription cream going forward.   Anemia - very mild on recent labs.  Recheck in 3-4 mo.  SOB - reviewed recent normal PFTs.   Today after further discussion, his SOB is infrequent, intermittent.  If this worsens or continues, then  recheck.  Follow up: 69mo

## 2013-04-18 NOTE — Telephone Encounter (Signed)
I called out Androgel to his pharmacy per Crosby Oyster PA-C and I could not cancell his appointment because he already miss the appointment. It was on 04/08/13. CLS

## 2013-04-18 NOTE — Telephone Encounter (Signed)
I fax over his information to [facility] per Crosby Oyster PA-C. CLS

## 2013-04-18 NOTE — Telephone Encounter (Signed)
Message copied by Janeice Robinson on Fri Apr 18, 2013  2:19 PM ------      Message from: Aleen Campi, DAVID S      Created: Fri Apr 18, 2013  1:31 PM       Pls cancel the GI appt.  I had referred for persistent nausea, but he states today that this really hasn't been a persistent problem            pls call out the androgel today! ------

## 2013-04-19 LAB — PROLACTIN: Prolactin: 4 ng/mL (ref 2.1–17.1)

## 2013-04-19 LAB — FOLLICLE STIMULATING HORMONE: FSH: 3.6 m[IU]/mL (ref 1.4–18.1)

## 2013-04-19 LAB — LUTEINIZING HORMONE: LH: 5.6 m[IU]/mL (ref 1.5–9.3)

## 2013-04-23 ENCOUNTER — Other Ambulatory Visit: Payer: Self-pay | Admitting: Internal Medicine

## 2013-04-23 ENCOUNTER — Other Ambulatory Visit: Payer: Self-pay | Admitting: Medical

## 2013-04-23 ENCOUNTER — Ambulatory Visit
Admission: RE | Admit: 2013-04-23 | Discharge: 2013-04-23 | Disposition: A | Discharge status: Home / Self Care | Payer: BC Managed Care – PPO | Source: Ambulatory Visit | Attending: Medical | Admitting: Medical

## 2013-04-23 DIAGNOSIS — M25562 Pain in left knee: Secondary | ICD-10-CM

## 2013-04-28 ENCOUNTER — Encounter: Payer: Self-pay | Admitting: Medical

## 2013-05-05 ENCOUNTER — Other Ambulatory Visit: Payer: Self-pay | Admitting: Medical

## 2013-05-05 ENCOUNTER — Telehealth: Payer: Self-pay | Admitting: Medical

## 2013-05-05 MED ORDER — DICLOFENAC SODIUM 1 % TD GEL: 2.0000 g | Freq: Four times a day (QID) | TRANSDERMAL | Status: DC

## 2013-05-05 NOTE — Telephone Encounter (Signed)
Colin Sutton, What do you want to do instead of Pennsaid or Diclofenac ?

## 2013-05-05 NOTE — Telephone Encounter (Signed)
I will send Diclofenac topical as a script.  Otherwise can use Aleve OTC daily along with Glucosamine Chondroitin

## 2013-05-12 ENCOUNTER — Telehealth: Payer: Self-pay | Admitting: Medical

## 2013-05-12 NOTE — Telephone Encounter (Signed)
If he has been using a full month at 1 pump per shoulder daily, have him increase to 3 pumps total daily (1.5 pumps each shoulder) and see if this makes any difference after 2-3 more weeks.  plan to retest TST lab level in 6mo

## 2013-05-13 ENCOUNTER — Telehealth: Payer: Self-pay | Admitting: Medical

## 2013-05-13 NOTE — Telephone Encounter (Signed)
lm

## 2013-05-14 NOTE — Telephone Encounter (Signed)
I have tried to call this patient for the past few days but the telephone number does not work so I am saving the message to the chart. CLS

## 2013-06-02 ENCOUNTER — Telehealth: Payer: Self-pay | Admitting: Medical

## 2013-06-02 NOTE — Telephone Encounter (Signed)
I spoke with the patient and I explain to him his directions. He states he was never aware of the new directions so I told him to try the new directions for 2 weeks call us back and let me know how he is feeling. CLS

## 2013-06-02 NOTE — Telephone Encounter (Signed)
At the last phone call, I advised to increase to 1.5 pumps per shoulder daily or 3 pumps total daily since he had no improvement after the first several weeks.  At this point, its time to recheck a TST level.  Is his symptoms improving?  Return for TST level.

## 2013-06-10 ENCOUNTER — Telehealth: Payer: Self-pay | Admitting: Medical

## 2013-06-10 NOTE — Telephone Encounter (Signed)
I called and spoke with the patient and he will call me back next week to let me know how he is doing on the increase in his Androgel. CLS

## 2013-06-17 ENCOUNTER — Other Ambulatory Visit: Payer: Self-pay | Admitting: Family Medicine

## 2013-06-17 MED ORDER — TESTOSTERONE 20.25 MG/1.25GM (1.62%) TD GEL: 3.0000 | TRANSDERMAL | Status: DC

## 2013-06-18 ENCOUNTER — Other Ambulatory Visit: Payer: Self-pay | Admitting: Family Medicine

## 2013-06-18 NOTE — Telephone Encounter (Signed)
Open by mistake

## 2013-07-07 ENCOUNTER — Telehealth: Payer: Self-pay | Admitting: Medical

## 2013-07-07 NOTE — Telephone Encounter (Signed)
Can come in initially for lab visit only for testosterone if he has been using his testosterone daily/regularly.

## 2013-07-08 NOTE — Telephone Encounter (Signed)
I left the patient a voicemail that he could come by the office for lab visit to check his testosterone levels. CLS

## 2013-07-09 ENCOUNTER — Other Ambulatory Visit: Payer: BC Managed Care – PPO

## 2013-07-09 DIAGNOSIS — E291 Testicular hypofunction: Secondary | ICD-10-CM

## 2013-07-09 NOTE — Addendum Note (Signed)
Addended by: Leretha Dykes L on: 07/09/2013 11:29 AM   Modules accepted: Orders

## 2013-07-10 LAB — TESTOSTERONE: Testosterone: 870 ng/dL (ref 300–890)

## 2013-07-14 ENCOUNTER — Telehealth: Payer: Self-pay | Admitting: Medical

## 2013-07-14 NOTE — Telephone Encounter (Signed)
See other message regarding labs.  Please refill, but let me know about the other message.

## 2013-07-15 ENCOUNTER — Telehealth: Payer: Self-pay | Admitting: Medical

## 2013-07-18 MED ORDER — TESTOSTERONE 20.25 MG/1.25GM (1.62%) TD GEL: TRANSDERMAL | Status: DC

## 2013-07-18 NOTE — Addendum Note (Signed)
Addended by: Barbette Or A on: 07/18/2013 10:12 AM   Modules accepted: Orders, Medications

## 2013-08-05 ENCOUNTER — Other Ambulatory Visit: Payer: Self-pay | Admitting: Family Medicine

## 2013-08-05 ENCOUNTER — Telehealth: Payer: Self-pay | Admitting: Medical

## 2013-08-05 NOTE — Telephone Encounter (Signed)
Patient is aware of Kristian Covey PA-C message. He will follow up here in 1 month. CLS

## 2013-08-05 NOTE — Telephone Encounter (Signed)
Verify dose, call/print script, see message below, f/u appt in 26mo

## 2013-08-05 NOTE — Telephone Encounter (Signed)
tsd  °

## 2013-10-22 ENCOUNTER — Ambulatory Visit (INDEPENDENT_AMBULATORY_CARE_PROVIDER_SITE_OTHER): Payer: BC Managed Care – PPO | Admitting: Medical

## 2013-10-22 ENCOUNTER — Encounter: Payer: Self-pay | Admitting: Medical

## 2013-10-22 VITALS — BP 100/80 | HR 60 | Temp 97.7°F | Resp 16 | Wt 260.0 lb

## 2013-10-22 DIAGNOSIS — R609 Edema, unspecified: Secondary | ICD-10-CM

## 2013-10-22 DIAGNOSIS — D649 Anemia, unspecified: Secondary | ICD-10-CM

## 2013-10-22 DIAGNOSIS — E291 Testicular hypofunction: Secondary | ICD-10-CM

## 2013-10-22 MED ORDER — TESTOSTERONE 20.25 MG/1.25GM (1.62%) TD GEL: TRANSDERMAL | Status: DC

## 2013-10-22 NOTE — Progress Notes (Signed)
    Subjective:   Colin Sutton is a 48 y.o. male presenting on 10/22/2013 with Follow-up  Here for f/u on hypogonadism.  Since last visit only using 2 pumps daily, 1 each shoulder.  Having trouble getting info right between us and pharmacy, although he is suppose to be on 2 pumps daily, the quantity is not correct.  When he was on 2 pumps each shoulder daily had lots more energy.   Anemia - labs in August showed mild anemia.   He denies hx/o anemia.  Denies blood in urine or stool.  Back in august when we checked labs he was working a lot outside, drinking large amounts of water.    He recently had some mild ankle edema only when he was drinking soda, has questions about this. No chest pain, shortness of breath persistent swelling.  Since his last visit here in August he lost a lot of weight through exercise and working on the job.  No other complaint.  Review of Systems ROS as in subjective      Objective:     Filed Vitals:   10/22/13 0921  BP: 100/80  Pulse: 60  Temp: 97.7 F (36.5 C)  Resp: 16    General appearance: alert, no distress, WD/WN Heart: RRR, normal S1, S2, no murmurs Lungs: CTA bilaterally, no wheezes, rhonchi, or rales Abdomen: +bs, soft, non tender, non distended, no masses, no hepatomegaly, no splenomegaly Pulses: 2+ symmetric, upper and lower extremities, normal cap refill Ext: no edema, no cyanosis, no clubbing     Assessment: Encounter Diagnoses  Name Primary?  . 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 Anemia   . Edema      Plan: There has been confusion and miscommunication over the quantity per month [facility], and he has been unable to get the proper quantity use 3 pumps per month. I called the pharmacy and I believe we have this worked out now. I wrote a new prescription today for 3 pumps total daily, 1.5 pump per shoulder daily and AndroGel.  He'll return for lab visit in one month.  If for reason we have issues with this we will change to  RwandaForteta or Axiron   Anemia - Return for labs to recheck the blood count in one month when he returns for testosterone lab  Edema - limit soda intake, c/t regular exercise and good water intake  Earl LitesGregory was seen today for follow-up.  Diagnoses and associated orders for this visit:  Hypogonadism male - Testosterone; Future  Anemia - CBC with Differential; Future  Other Orders - Testosterone (ANDROGEL) 20.25 MG/1.25GM (1.62%) GEL; Place 2.5 pumps on skin daily    Return 63mo for lab nurse visit only.

## 2013-11-24 ENCOUNTER — Other Ambulatory Visit: Payer: Self-pay | Admitting: Medical

## 2013-11-24 ENCOUNTER — Telehealth: Payer: Self-pay | Admitting: Medical

## 2013-11-24 MED ORDER — OMEPRAZOLE 40 MG PO CPDR: 40.0000 mg | DELAYED_RELEASE_CAPSULE | Freq: Every day | ORAL | Status: DC

## 2013-11-24 NOTE — Telephone Encounter (Signed)
Trial of Omeprazole sent

## 2013-11-24 NOTE — Telephone Encounter (Signed)
Pt called and stated that he has seen you for heartburn and you  gave him prilosec. He states it hasnt been working. 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 Pt is requesting a rx for that. Pt was informed he would need an appt. But he requested a note be put back. Pt uses [facility].

## 2013-12-15 ENCOUNTER — Ambulatory Visit: Payer: Self-pay | Admitting: Medical

## 2013-12-15 ENCOUNTER — Encounter: Payer: Self-pay | Admitting: Medical

## 2013-12-16 ENCOUNTER — Encounter: Payer: Self-pay | Admitting: Medical

## 2013-12-16 ENCOUNTER — Ambulatory Visit (INDEPENDENT_AMBULATORY_CARE_PROVIDER_SITE_OTHER): Payer: BC Managed Care – PPO | Admitting: Medical

## 2013-12-16 VITALS — BP 122/80 | HR 72 | Temp 98.1°F | Resp 16 | Wt 277.0 lb

## 2013-12-16 DIAGNOSIS — N529 Male erectile dysfunction, unspecified: Secondary | ICD-10-CM

## 2013-12-16 DIAGNOSIS — D649 Anemia, unspecified: Secondary | ICD-10-CM

## 2013-12-16 DIAGNOSIS — E291 Testicular hypofunction: Secondary | ICD-10-CM

## 2013-12-16 LAB — CBC WITH DIFFERENTIAL/PLATELET
BASOS PCT: 1 % (ref 0–1)
Basophils Absolute: 0.1 10*3/uL (ref 0.0–0.1)
Eosinophils Absolute: 0.3 10*3/uL (ref 0.0–0.7)
Eosinophils Relative: 5 % (ref 0–5)
HCT: 42 % (ref 39.0–52.0)
Hemoglobin: 14.7 g/dL (ref 13.0–17.0)
Lymphocytes Relative: 35 % (ref 12–46)
Lymphs Abs: 2 10*3/uL (ref 0.7–4.0)
MCH: 29.2 pg (ref 26.0–34.0)
MCHC: 35 g/dL (ref 30.0–36.0)
MCV: 83.3 fL (ref 78.0–100.0)
Monocytes Absolute: 0.7 10*3/uL (ref 0.1–1.0)
Monocytes Relative: 12 % (ref 3–12)
NEUTROS PCT: 47 % (ref 43–77)
Neutro Abs: 2.7 10*3/uL (ref 1.7–7.7)
PLATELETS: 219 10*3/uL (ref 150–400)
RBC: 5.04 MIL/uL (ref 4.22–5.81)
RDW: 13.6 % (ref 11.5–15.5)
WBC: 5.8 10*3/uL (ref 4.0–10.5)

## 2013-12-16 MED ORDER — TESTOSTERONE 20.25 MG/1.25GM (1.62%) TD GEL: TRANSDERMAL | Status: DC

## 2013-12-16 MED ORDER — AVANAFIL 100 MG PO TABS: 1.0000 | ORAL_TABLET | Freq: Every day | ORAL | Status: DC | PRN

## 2013-12-16 MED ORDER — VARDENAFIL HCL 20 MG PO TABS: 20.0000 mg | ORAL_TABLET | Freq: Every day | ORAL | Status: DC | PRN

## 2013-12-16 NOTE — Patient Instructions (Signed)
Thank you for giving me the opportunity to serve you today.    Your diagnosis today includes: Encounter Diagnoses  Name Primary?  . 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 Anemia   . Erectile dysfunction      Specific recommendations today include:  We'll call you back with lab results, and assuming the testosterone level is normal, we will continue the same dose of 3 pumps total daily of Androgel.  Begin trial of medication to help with erections.  I gave you 2 different medications to try, Levitra 20 mg, Stendra 100 mg.  Only use one per day either as needed  Start out with one half tablet of either Levitra for [place].    If one half tablet of either one of the medications doesn't work, you may go up to one whole tablet  Return pending labs.  Levitra/Vardenafil tablets What is this medicine? VARDENAFIL (var den a fil) is used to treat erection problems in men. This medicine may be used for other purposes; ask your health care provider or pharmacist if you have questions. COMMON BRAND NAME(S): Levitra What should I tell my health care provider before I take this medicine? They need to know if you have any of these conditions: -bleeding disorders -eye or vision problems, including a rare inherited eye disease called retinitis pigmentosa -anatomical deformation of the penis, Peyronie's disease, or history of priapism (painful and prolonged erection) -heart disease, angina, a history of heart attack, irregular heart beats, or other heart problems -high or low blood pressure -history of blood diseases, like sickle cell anemia or leukemia -history of stomach bleeding -kidney disease -liver disease -stroke -an unusual or allergic reaction to vardenafil, other medicines, foods, dyes, or preservatives -pregnant or trying to get pregnant -breast-feeding How should I use this medicine? Take this medicine by mouth with a glass of water. Follow the directions on the prescription label. You  may take this medicine with or without meals. The dose is usually taken 1 hour before sexual activity. You should not take this dose more than once per day. Do not take your medicine more often than directed. Talk to your pediatrician regarding the use of this medicine in children. Special care may be needed. Overdosage: If you think you have taken too much of this medicine contact a poison control center or emergency room at once. NOTE: This medicine is only for you. Do not share this medicine with others. What if I miss a dose? This does not apply. What may interact with this medicine? Do not take this medicine with any of the following medications: -bepridil -certain medicines for fungal infections like fluconazole, itraconazole, ketoconazole, posaconazole, voriconazole -cisapride -droperidol -grepafloxacin -medicines for irregular heartbeat like dronedarone, dofetilide -methscopolamine nitrate -nitrates like amyl nitrite, isosorbide dinitrate, isosorbide mononitrate, nitroglycerin -nitroprusside -other medicines for erectile dysfunction like avanafil, sildenafil, tadalafil -pimozide -thioridazine -ziprasidone  This medicine may also interact with the following medications: -antiviral medicines for HIV or AIDS -certain antibiotics like erythromycin and clarithromycin -certain drugs for high blood pressure -medicines for prostate problems -other medicines that prolong the QT interval (cause an abnormal heart rhythm) This list may not describe all possible interactions. Give your health care provider a list of all the medicines, herbs, non-prescription drugs, or dietary supplements you use. Also tell them if you smoke, drink alcohol, or use illegal drugs. Some items may interact with your medicine. What should I watch for while using this medicine? If you notice any changes in your vision while taking this  drug, call your doctor or health care professional as soon as possible. Stop  using this medicine and call your health care provider right away if you have a loss of sight in one or both eyes. Contact your doctor or health care professional right away if the erection lasts longer than 4 hours or if it becomes painful. This may be a sign of serious problem and must be treated right away to prevent permanent damage. If you experience symptoms of nausea, dizziness, chest pain or arm pain upon initiation of sexual activity after taking this medicine, you should refrain from further activity and call your doctor or health care professional as soon as possible. Do not drink alcohol to excess (examples, 5 glasses of wine or 5 shots of whiskey) when taking this medicine. When taken in excess, alcohol can increase your chances of getting a headache or getting dizzy, increasing your heart rate or lowering your blood pressure. Using this medicine does not protect you or your partner against HIV infection (the virus that causes AIDS) or other sexually transmitted diseases. What side effects may I notice from receiving this medicine? Side effects that you should report to your doctor or health care professional as soon as possible. -allergic reactions like skin rash, itching or hives, swelling of the face, lips, or tongue -breathing problems -changes in hearing -changes in vision -chest pain -fast, irregular heartbeat -prolonged or painful erection -seizures  Side effects that usually do not require medical attention (report to your doctor or health care professional if they continue or are bothersome): -back pain -dizziness -flushing -headache -indigestion -muscle aches -nausea -stuffy or runny nose This list may not describe all possible side effects. Call your doctor for medical advice about side effects. You may report side effects to FDA at 1-800-FDA-1088. Where should I keep my medicine? Keep out of the reach of children. Store at room temperature between 15 and 30 degrees  C (59 and 86 degrees F). Throw away any unused medicine after the expiration date. NOTE: This sheet is a summary. It may not cover all possible information. If you have questions about this medicine, talk to your doctor, pharmacist, or health care provider.  2014, Elsevier/Gold Standard. (2013-04-21 15:23:24)   Stendra/Avanafil oral tablets What is this medicine? AVANAFIL (ah VA Na fil) is used to treat erection problems in men. This medicine may be used for other purposes; ask your health care provider or pharmacist if you have questions. COMMON BRAND NAME(S): Jerral Ralph What should I tell my health care provider before I take this medicine? They need to know if you have any of these conditions:bleeding disorders -eye or vision problems, including a rare inherited eye disease called retinitis pigmentosa -anatomical deformation of the penis, Peyronie's disease, or history of priapism (painful and prolonged erection) -heart disease, angina, a history of heart attack, irregular heart beats, or other heart problems -high or low blood pressure -history of blood diseases, like sickle cell anemia or leukemia -history of stomach bleeding -kidney disease -liver disease -stroke -an unusual or allergic reaction to avanafil, other medicines, foods, dyes, or preservatives -pregnant or trying to get pregnant -breast-feeding How should I use this medicine? Take this medicine by mouth with a glass of water. Follow the directions on the prescription label. The dose is taken 15 to 30 minutes before sexual activity, depending on the dose you are being prescribed. You should not take the dose more than once per day. Do not take your medicine more often than directed.  Talk to your pediatrician regarding the use of this medicine in children. This medicine is not used in children for this condition. Overdosage: If you think you've taken too much of this medicine contact a poison control center or emergency room  at once. Overdosage: If you think you have taken too much of this medicine contact a poison control center or emergency room at once. NOTE: This medicine is only for you. Do not share this medicine with others. What if I miss a dose? This does not apply. Do not take double or extra doses. What may interact with this medicine? Do not take this medicine with any of the following medications: -methscopolamine nitrate -nitrates like amyl nitrite, isosorbide dinitrate, isosorbide mononitrate, nitroglycerin -other medicines for erectile dysfunction like sildenafil, tadalafil, vardenafil  This medicine may also interact with the following medications: -carbamazepine -certain drugs for high blood pressure -certain drugs for the treatment of HIV infection or AIDS -certain drugs used for fungal or yeast infections, like fluconazole, itraconazole, ketoconazole, and voriconazole -grapefruit juice -macrolide antibiotics like clarithromycin, erythromycin, troleandomycin -medicines for prostate problems -phenobarbital -phenytoin -rifabutin, rifampin or rifapentine -St. John's wort This list may not describe all possible interactions. Give your health care provider a list of all the medicines, herbs, non-prescription drugs, or dietary supplements you use. Also tell them if you smoke, drink alcohol, or use illegal drugs. Some items may interact with your medicine. What should I watch for while using this medicine? If you notice any changes in your vision while taking this drug, call your doctor or health care professional as soon as possible. Stop using this medicine and call your health care provider right away if you have a loss of sight in one or both eyes. Contact your doctor or health care professional right away if you have an erection that lasts longer than 4 hours or if it becomes painful. This may be a sign of a serious problem and must be treated right away to prevent permanent damage. If you  experience symptoms of nausea, dizziness, chest pain or arm pain upon initiation of sexual activity after taking this medicine, you should refrain from further activity and call your doctor or health care professional as soon as possible. Do not drink alcohol to excess (examples, 5 glasses of wine or 5 shots of whiskey) when taking this medicine. When taken in excess, alcohol can increase your chances of getting a headache or getting dizzy, increasing your heart rate or lowering your blood pressure. Using this medicine does not protect you or your partner against HIV infection (the virus that causes AIDS) or other sexually transmitted diseases. What side effects may I notice from receiving this medicine? Side effects that you should report to your doctor or health care professional as soon as possible: -allergic reactions like skin rash, itching or hives, swelling of the face, lips, or tongue -breathing problems -changes in hearing -changes in vision -chest pain -fast, irregular heartbeat -prolonged or painful erection -seizures  Side effects that usually do not require medical attention (report to your doctor or health care professional if they continue or are bothersome): -back pain -dizziness -flushing -headache -indigestion -muscle aches -nausea -stuffy or runny nose This list may not describe all possible side effects. Call your doctor for medical advice about side effects. You may report side effects to FDA at 1-800-FDA-1088. Where should I keep my medicine? Keep out of the reach of children. Store at room temperature between 20 and 30 degrees C (68 and 86  degrees F). Protect from light. Throw away any unused medicine after the expiration date. NOTE: This sheet is a summary. It may not cover all possible information. If you have questions about this medicine, talk to your doctor, pharmacist, or health care provider.  2014, Elsevier/Gold Standard. (2013-05-06 17:42:00)

## 2013-12-16 NOTE — Progress Notes (Signed)
Subjective: Here for f/u on hypogonadism.  Using Androgel 3 pumps total daily.   The medication seems to be working.  He can difference between 2 pumps daily and 3 pumps daily.   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  Doesn't notice any major difference with erections on androgel.  No major relationship in last 4 years until this year with current girlfriend.    He does have problems with erections.  Has difficulty getting and sustaining erections.   Father has similar problem, uses cialis.  Tried one of dad's cialis and it worked Firef[employer].  Tried Viagra in the past but gave him headaches.  Patient's cardiac risk factors are: male gender. Patient's risk factors for DVT/PE: none. Previous cardiac testing: He does report hx/o stress test 2004 and EKG, [place], normal study per his recollection.  Here for repeat CBC for anemia noted, mild, no last labs.   Objective: Filed Vitals:   12/16/13 0813  BP: 122/80  Pulse: 72  Temp: 98.1 F (36.7 C)  Resp: 16    General appearence: alert, no distress, WD/WN Neck: supple, no lymphadenopathy, no thyromegaly, no masses, no bruits Heart: RRR, normal S1, S2, no murmurs Lungs: CTA bilaterally, no wheezes, rhonchi, or rales Abdomen: +bs, soft, non tender, non distended, no masses, no hepatomegaly, no splenomegaly Pulses: 2+ symmetric, upper and lower extremities, normal cap refill Ext: no edema   Adult ECG Report  Indication: erectile dysfunction  Rate: 70bpm  Rhythm: normal sinus rhythm  QRS Axis: 43 degrees  PR Interval: 196ms  QRS Duration: 100ms  QTc: 406ms  Conduction Disturbances: none  Other Abnormalities: none  Patient's cardiac risk factors are: male gender.  EKG comparison: none  Narrative Interpretation: normal EKG    Assessment: Encounter Diagnoses  Name Primary?  . 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 Anemia   . Erectile dysfunction    Plan: Hypogonadism - doing well on Androgel 3 pumps total daily.  TST  lab today.  Anemia - repeat lab today for surveillance/recheck  Erectile Dysfunction - Reviewed pathophysiology and differential diagnosis of erectile dysfunction with the patient.  Discussed treatment options.  Begin trial of Stendra and Levitra separately.  Discussed potential risks of medications including hypotension and priapism.  Discussed proper use of medication.  Questions were answered.  Recheck 70mo

## 2013-12-17 LAB — TESTOSTERONE: TESTOSTERONE: 577 ng/dL (ref 300–890)

## 2014-01-26 ENCOUNTER — Other Ambulatory Visit: Payer: Self-pay | Admitting: Medical

## 2014-02-24 ENCOUNTER — Other Ambulatory Visit: Payer: Self-pay | Admitting: Medical

## 2014-02-24 ENCOUNTER — Telehealth: Payer: Self-pay | Admitting: Medical

## 2014-02-24 MED ORDER — VARDENAFIL HCL 20 MG PO TABS: 20.0000 mg | ORAL_TABLET | Freq: Every day | ORAL | Status: DC | PRN

## 2014-02-24 MED ORDER — OMEPRAZOLE 40 MG PO CPDR: 40.0000 mg | DELAYED_RELEASE_CAPSULE | Freq: Every day | ORAL | Status: DC

## 2014-02-24 NOTE — Telephone Encounter (Signed)
Pt notified that coupon card for Levitra will be up front

## 2014-02-24 NOTE — Telephone Encounter (Signed)
rx sent, make sure he has coupon card for Levitra

## 2014-02-25 ENCOUNTER — Telehealth: Payer: Self-pay | Admitting: Internal Medicine

## 2014-02-25 NOTE — Telephone Encounter (Signed)
Initiate P.A.for levitra

## 2014-02-26 NOTE — Telephone Encounter (Signed)
P.A for Levitra has been approved 02/25/14-12-31/2039. Pt has been notified that it has been approved. Faxed approval to [facility]

## 2014-02-27 ENCOUNTER — Telehealth: Payer: Self-pay | Admitting: Family Medicine

## 2014-02-27 NOTE — Telephone Encounter (Signed)
Patient called and said he is getting a touch of Gout. Can we send in a RX for Allopurinol to his pharmacy. CLS

## 2014-02-27 NOTE — Telephone Encounter (Signed)
I left a message for the patient. CLS

## 2014-02-27 NOTE — Telephone Encounter (Signed)
First off I don't think I have ever seen him for gout, no recorded chart hx/o prior gout.  So recommend appt.  Can probably work him in.  Allopurinol is for prevention of gout, and in the acute setting can actually worsen a gout flare, so this would not be appropriate.

## 2014-03-16 ENCOUNTER — Other Ambulatory Visit: Payer: Self-pay | Admitting: Medical

## 2014-03-16 ENCOUNTER — Encounter: Payer: BC Managed Care – PPO | Admitting: Medical

## 2014-03-16 NOTE — Telephone Encounter (Signed)
Call out the medication x 1.

## 2014-03-16 NOTE — Telephone Encounter (Signed)
Is this okay to call out? 

## 2014-03-17 ENCOUNTER — Telehealth: Payer: Self-pay | Admitting: Medical

## 2014-03-17 NOTE — Telephone Encounter (Signed)
I called out his medication to [facility]. CLS

## 2014-03-17 NOTE — Telephone Encounter (Signed)
Pt called and rescheduled cpe and stated he needs a refill on androgel. He needs enough until his rescheduled cpe which is first of September. Pt uses [facility].

## 2014-03-17 NOTE — Telephone Encounter (Signed)
I called out his medication to his pharmacy per David Tysinger PAC. CLS 

## 2014-03-17 NOTE — Telephone Encounter (Signed)
Call it out 

## 2014-03-30 ENCOUNTER — Encounter: Payer: Self-pay | Admitting: Medical

## 2014-03-30 ENCOUNTER — Telehealth: Payer: Self-pay | Admitting: Medical

## 2014-03-30 ENCOUNTER — Ambulatory Visit (INDEPENDENT_AMBULATORY_CARE_PROVIDER_SITE_OTHER): Payer: BC Managed Care – PPO | Admitting: Medical

## 2014-03-30 VITALS — BP 130/84 | HR 80 | Temp 97.6°F | Resp 16 | Ht 74.0 in | Wt 255.0 lb

## 2014-03-30 DIAGNOSIS — K219 Gastro-esophageal reflux disease without esophagitis: Secondary | ICD-10-CM

## 2014-03-30 DIAGNOSIS — Z23 Encounter for immunization: Secondary | ICD-10-CM

## 2014-03-30 DIAGNOSIS — Z79899 Other long term (current) drug therapy: Secondary | ICD-10-CM

## 2014-03-30 DIAGNOSIS — E291 Testicular hypofunction: Secondary | ICD-10-CM

## 2014-03-30 DIAGNOSIS — Z0000 Encounter for general adult medical examination without abnormal findings: Secondary | ICD-10-CM

## 2014-03-30 LAB — POCT URINALYSIS DIPSTICK
Bilirubin, UA: NEGATIVE
Blood, UA: NEGATIVE
GLUCOSE UA: NEGATIVE
Ketones, UA: NEGATIVE
LEUKOCYTES UA: NEGATIVE
NITRITE UA: NEGATIVE
Protein, UA: NEGATIVE
Spec Grav, UA: 1.02
Urobilinogen, UA: NEGATIVE
pH, UA: 5

## 2014-03-30 MED ORDER — OMEPRAZOLE 40 MG PO CPDR: 40.0000 mg | DELAYED_RELEASE_CAPSULE | Freq: Every day | ORAL | Status: DC

## 2014-03-30 MED ORDER — MIRTAZAPINE 15 MG PO TABS: 15.0000 mg | ORAL_TABLET | Freq: Every day | ORAL | Status: DC

## 2014-03-30 MED ORDER — VARDENAFIL HCL 20 MG PO TABS: 20.0000 mg | ORAL_TABLET | Freq: Every day | ORAL | Status: DC | PRN

## 2014-03-30 NOTE — Progress Notes (Signed)
Subjective:   HPI  Colin Sutton is a 48 y.o. male who presents for a complete physical.  Medical care team includes:  [facility] Halamos[facility]  Dr. Evelene CroonKaur, psychiatry  Kristian CoveyShane Tysinger, PA-C here for primary care   Preventative care: Last ophthalmology visit: 1982 Last dental visit: over 5 years Last colonoscopy: never Last prostate exam:  Last EKG: Last labs: last year   Prior vaccinations: TD or Tdap: October last year Influenza:never  Concerns: none  Reviewed their medical, surgical, family, social, medication, and allergy history and updated chart as appropriate.  Past Medical History  Diagnosis Date  . Seasonal allergic rhinitis   . GERD (gastroesophageal reflux disease)   . Rash     intermittent  . MRSA infection 06/[facility], started with abrasion to knee  . ADHD (attention deficit hyperactivity disorder)     Dr. Evelene CroonKaur  . Anxiety   . Lumbar degenerative disc disease     [facility]amos[facility]  . 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 Asthma     PFT 04/08/13 normal spirometry flows with bronchodilator response, normal lung volumes and normal diffusion    Past Surgical History  Procedure Laterality Date  . Carpal tunnel release      bilat  . Ulnar tunnel release      right arm  . Peripherally inserted central catheter insertion  06/2009    Vancomycin for MRSA infection     History   Social History  . Marital Status: Married    Spouse Name: N/A    Number of Children: N/A  . Years of Education: N/A   Occupational History  . Not on file.   Social History Main Topics  . Smoking status: Never Smoker   . Smokeless tobacco: Not on file  . Alcohol Use: No  . Drug Use: No  . Sexual Activity: Not on file   Other Topics Concern  . 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   Single.   Has 2 chlidren.  Christian faith.     Family History   Problem Relation Age of Onset  . COPD Mother   . Diabetes Mother   . Heart disease Father 1968  . Diabetes Father   . Asthma Brother   . Cancer Neg Hx   . Stroke Neg Hx     Current outpatient prescriptions:ALPRAZolam (XANAX) 1 MG tablet, Take 1 mg by mouth 4 (four) times daily., Disp: , Rfl: ;  ANDROGEL PUMP 20.25 MG/ACT (1.62%) GEL, USE 3 PUMPS DAILY (1.5 ON EACH SHOULDER), Disp: 150 g, Rfl: 1;  dextroamphetamine (DEXEDRINE SPANSULE) 10 MG 24 hr capsule, Take 10 mg by mouth 6 (six) times daily., Disp: , Rfl:  mirtazapine (REMERON) 15 MG tablet, Take 1 tablet (15 mg total) by mouth at bedtime., Disp: 90 tablet, Rfl: 1;  omeprazole (PRILOSEC) 40 MG capsule, Take 1 capsule (40 mg total) by mouth daily., Disp: 90 capsule, Rfl: 3;  oxyCODONE (ROXICODONE) 15 MG immediate release tablet, Take 30 mg by mouth every 4 (four) hours as needed for pain. , Disp: , Rfl:  vardenafil (LEVITRA) 20 MG tablet, Take 1 tablet (20 mg total) by mouth daily as needed for erectile dysfunction., Disp: 10 tablet, Rfl: 3;  Testosterone (ANDROGEL) 20.25 MG/1.25GM (1.62%) GEL, Place 2.5 pumps on skin daily, Disp: 2.5 g, Rfl: 2  Allergies  Allergen  Reactions  . Acetaminophen     GI upset, diarrhea  . Morphine And Related     Sick, dizzy, nausea, vomiting    Review of Systems Constitutional: -fever, -chills, -sweats, -unexpected weight change, -decreased appetite, -fatigue Allergy: -sneezing, -itching, -congestion Dermatology: -changing moles, --rash, -lumps ENT: -runny nose, -ear pain, -sore throat, -hoarseness, -sinus pain, -teeth pain, - ringing in ears, -hearing loss, -nosebleeds Cardiology: -chest pain, -palpitations, -swelling, -difficulty breathing when lying flat, -waking up short of breath Respiratory: -cough, -shortness of breath, -difficulty breathing with exercise or exertion, -wheezing, -coughing up blood Gastroenterology: -abdominal pain, -nausea, -vomiting, -diarrhea, -constipation, -blood in stool,  -changes in bowel movement, -difficulty swallowing or eating Hematology: -bleeding, -bruising  Musculoskeletal: -joint aches, -muscle aches, -joint swelling, +back pain, -neck pain, -cramping, -changes in gait Ophthalmology: denies vision changes, eye redness, itching, discharge Urology: -burning with urination, -difficulty urinating, -blood in urine, -urinary frequency, -urgency, -incontinence Neurology: -headache, -weakness, -tingling, -numbness, -memory loss, -falls, -dizziness Psychology: -depressed mood, -agitation, -sleep problems     Objective:   Physical Exam  BP 130/84  Pulse 80  Temp(Src) 97.6 F (36.4 C) (Oral)  Resp 16  Ht 6\' 2"  (1.88 m)  Wt 255 lb (115.667 kg)  BMI 32.73 kg/m2  BP Readings from Last 3 Encounters:  03/30/14 130/84  12/16/13 122/80  10/22/13 100/80   Wt Readings from Last 3 Encounters:  03/30/14 255 lb (115.667 kg)  12/16/13 277 lb (125.646 kg)  10/22/13 260 lb (117.935 kg)   Skin: left upper anterior thigh with horizontal scar, right forehead above eyebrow with vertical scar, few scattered macules, right lateral buttock with large pedunculated skin tag, no worrisome lesions  HEENT: normocephalic, conjunctiva/corneas normal, sclerae anicteric, PERRLA, EOMi, nares patent, no discharge or erythema, pharynx normal  Oral cavity: MMM, tongue normal, teeth with significant plaque  Neck: supple, no lymphadenopathy, no thyromegaly, no masses, normal ROM, no bruits  Chest: non tender, normal shape and expansion  Heart: RRR, normal S1, S2, no murmurs  Lungs: CTA bilaterally, no wheezes, rhonchi, or rales  Abdomen: +bs, soft, non tender, non distended, no masses, no hepatomegaly, no splenomegaly, no bruits  Back: non tender, normal ROM, no scoliosis  Musculoskeletal:  upper extremities non tender, no obvious deformity, normal ROM throughout, lower extremities non tender, no obvious deformity, normal ROM throughout  Extremities: no edema, no cyanosis, no  clubbing  Pulses: 2+ symmetric, upper and lower extremities, normal cap refill  Neurological: alert, oriented x 3, CN2-12 intact, strength normal upper extremities and lower extremities, sensation normal throughout, DTRs 2+ throughout, no cerebellar signs, gait normal  Psychiatric: normal affect, behavior normal, pleasant  GU: normal male external genitalia, circumcised, hypogonadal left testicule, othwerise nontender, no masses, no hernia, no lymphadenopathy Rectal: deferred   Assessment and Plan :    Encounter Diagnoses  Name Primary?  . Routine general medical examination at a health care facility Yes  . High risk medication use   . Hypogonadism in male   . Gastroesophageal reflux disease without esophagitis   . Need for prophylactic vaccination and inoculation against influenza    Physical exam - discussed healthy lifestyle, diet, exercise, preventative care, vaccinations, and addressed their concerns.   Labs today, doing well on current medications.  reviewed recent labs.  Advised he see dentist and eye doctor yearly.   Refilled medications. Counseled on the influenza virus vaccine.  Vaccine information sheet given.  Influenza vaccine given after consent obtained. Follow-up pending labs

## 2014-03-30 NOTE — Telephone Encounter (Signed)
Needs refill on androgel also, he just got his last refill

## 2014-03-30 NOTE — Telephone Encounter (Signed)
Call out Androgel with 2 refills

## 2014-03-31 LAB — COMPREHENSIVE METABOLIC PANEL
ALBUMIN: 4.8 g/dL (ref 3.5–5.2)
ALK PHOS: 51 U/L (ref 39–117)
ALT: 15 U/L (ref 0–53)
AST: 16 U/L (ref 0–37)
BUN: 15 mg/dL (ref 6–23)
CALCIUM: 9.4 mg/dL (ref 8.4–10.5)
CHLORIDE: 99 meq/L (ref 96–112)
CO2: 30 mEq/L (ref 19–32)
Creat: 0.95 mg/dL (ref 0.50–1.35)
Glucose, Bld: 97 mg/dL (ref 70–99)
Potassium: 4.6 mEq/L (ref 3.5–5.3)
SODIUM: 137 meq/L (ref 135–145)
TOTAL PROTEIN: 7.6 g/dL (ref 6.0–8.3)
Total Bilirubin: 0.9 mg/dL (ref 0.2–1.2)

## 2014-03-31 LAB — PSA: PSA: 0.42 ng/mL (ref <=4.00)

## 2014-03-31 NOTE — Telephone Encounter (Signed)
Patient notified

## 2014-04-01 NOTE — Addendum Note (Signed)
Addended by: Herminio CommonsJOHNSON, Aubery Douthat A on: 04/01/2014 08:16 AM   Modules accepted: Orders

## 2014-04-15 ENCOUNTER — Encounter: Payer: BC Managed Care – PPO | Admitting: Medical

## 2014-05-30 ENCOUNTER — Other Ambulatory Visit: Payer: Self-pay | Admitting: Medical

## 2014-06-01 NOTE — Telephone Encounter (Signed)
Is this okay to refill? 

## 2014-06-01 NOTE — Telephone Encounter (Signed)
I called out the Androgel to his pharmacy per Crosby Oysteravid Tysinger PAc

## 2014-09-03 ENCOUNTER — Other Ambulatory Visit: Payer: Self-pay | Admitting: Medical

## 2014-10-01 ENCOUNTER — Telehealth: Payer: Self-pay | Admitting: Family Medicine

## 2014-10-01 NOTE — Telephone Encounter (Signed)
Call out medication with 2 refills

## 2014-10-01 NOTE — Telephone Encounter (Signed)
Pt called and needs refill for [facility]el to [facility]

## 2014-10-02 ENCOUNTER — Other Ambulatory Visit: Payer: Self-pay | Admitting: Family Medicine

## 2014-10-02 MED ORDER — TESTOSTERONE 20.25 MG/1.25GM (1.62%) TD GEL: TRANSDERMAL | Status: DC

## 2014-10-02 NOTE — Telephone Encounter (Signed)
I called out Androgel to his pharmacy per Crosby Oysteravid Tysinger PA request.

## 2014-12-07 ENCOUNTER — Telehealth: Payer: Self-pay | Admitting: Medical

## 2014-12-07 ENCOUNTER — Other Ambulatory Visit: Payer: Self-pay | Admitting: Medical

## 2014-12-07 MED ORDER — MIRTAZAPINE 15 MG PO TABS: 15.0000 mg | ORAL_TABLET | Freq: Every day | ORAL | Status: DC

## 2014-12-07 MED ORDER — VARDENAFIL HCL 20 MG PO TABS: 20.0000 mg | ORAL_TABLET | Freq: Every day | ORAL | Status: DC | PRN

## 2014-12-07 NOTE — Telephone Encounter (Signed)
Pt picked up last refill of Androgel & Remeron, Levitra.  Does he need to be seen or can he have refills ?

## 2014-12-07 NOTE — Telephone Encounter (Signed)
pls call out Remeron, Levitra sent, and please have them come him back in for follow-up on testosterone therapy. Since he is not completely out, he needs to come back before he runs out of Androgel so we can check labs to make sure that therapy is at the right dose.  Set up OV

## 2014-12-08 NOTE — Telephone Encounter (Signed)
Patient is aware of Colin CoveyShane Tysinger PA message and recommendations and he understood. Patient has his appointment on 12/23/14. I called out Remeron to his pharmacy per Crosby Oysteravid Tysinger PA

## 2014-12-23 ENCOUNTER — Ambulatory Visit (INDEPENDENT_AMBULATORY_CARE_PROVIDER_SITE_OTHER): Payer: BLUE CROSS/BLUE SHIELD | Admitting: Medical

## 2014-12-23 ENCOUNTER — Encounter: Payer: Self-pay | Admitting: Medical

## 2014-12-23 VITALS — BP 112/82 | HR 86 | Resp 15 | Wt 284.0 lb

## 2014-12-23 DIAGNOSIS — Z202 Contact with and (suspected) exposure to infections with a predominantly sexual mode of transmission: Secondary | ICD-10-CM

## 2014-12-23 DIAGNOSIS — E291 Testicular hypofunction: Secondary | ICD-10-CM | POA: Diagnosis not present

## 2014-12-23 DIAGNOSIS — Z79899 Other long term (current) drug therapy: Secondary | ICD-10-CM | POA: Diagnosis not present

## 2014-12-23 LAB — COMPREHENSIVE METABOLIC PANEL
ALT: 44 U/L (ref 0–53)
AST: 38 U/L — ABNORMAL HIGH (ref 0–37)
Albumin: 4 g/dL (ref 3.5–5.2)
Alkaline Phosphatase: 45 U/L (ref 39–117)
BUN: 9 mg/dL (ref 6–23)
CALCIUM: 9.1 mg/dL (ref 8.4–10.5)
CHLORIDE: 102 meq/L (ref 96–112)
CO2: 30 mEq/L (ref 19–32)
CREATININE: 0.88 mg/dL (ref 0.50–1.35)
Glucose, Bld: 96 mg/dL (ref 70–99)
POTASSIUM: 4.3 meq/L (ref 3.5–5.3)
SODIUM: 137 meq/L (ref 135–145)
Total Bilirubin: 0.5 mg/dL (ref 0.2–1.2)
Total Protein: 6.7 g/dL (ref 6.0–8.3)

## 2014-12-23 LAB — CBC
HCT: 43.6 % (ref 39.0–52.0)
Hemoglobin: 15 g/dL (ref 13.0–17.0)
MCH: 29.1 pg (ref 26.0–34.0)
MCHC: 34.4 g/dL (ref 30.0–36.0)
MCV: 84.5 fL (ref 78.0–100.0)
MPV: 10.6 fL (ref 8.6–12.4)
PLATELETS: 228 10*3/uL (ref 150–400)
RBC: 5.16 MIL/uL (ref 4.22–5.81)
RDW: 13.3 % (ref 11.5–15.5)
WBC: 6.8 10*3/uL (ref 4.0–10.5)

## 2014-12-23 NOTE — Progress Notes (Signed)
Subjective Here for recheck on hypogonadism, low T, using 1.5 pumps of Androgel each shoulder.  Doing fine on the medication, no prostate BPH symptoms.  No c/o.  Wants STD panel.  Girlfriend of 1 year recently had urinary issues and was told she had herpes.   She blamed him although he notes no prior hx/o herpes infection.   Otherwise in normal state of health.  No urinary or penile symptoms. No other aggravating or relieving factors. No other complaint.  Objective: BP 112/82 mmHg  Pulse 86  Resp 15  Wt 284 lb (128.822 kg)  Gen: wd, wn, nad Heart: RRR, normal s1, s2, no murmurs Lungs clear GU: normal male genitalia, circumcised, no mass, no lymphadenopathy, no hernia Rectal declined  Assessment: Encounter Diagnoses  Name Primary?  . Hypogonadism in male Yes  . High risk medication use   . Venereal disease contact     Plan: Colin Sutton was seen today for follow-up and medication refill.  Diagnoses and all orders for this visit:  Hypogonadism in male Orders: -     Testosterone -     Comprehensive metabolic panel -     CBC  High risk medication use  Venereal disease contact Orders: -     RPR -     HIV antibody -     GC/Chlamydia Probe Amp -     HSV 1 antibody, IgG -     HSV 2 antibody, IgG -     HSV(herpes simplex vrs) 1+2 ab-IgM

## 2014-12-24 LAB — TESTOSTERONE: TESTOSTERONE: 1490 ng/dL — AB (ref 300–890)

## 2014-12-24 LAB — GC/CHLAMYDIA PROBE AMP
CT Probe RNA: NEGATIVE
GC Probe RNA: NEGATIVE

## 2014-12-24 LAB — HIV ANTIBODY (ROUTINE TESTING W REFLEX): HIV 1&2 Ab, 4th Generation: NONREACTIVE

## 2014-12-24 LAB — RPR

## 2014-12-25 ENCOUNTER — Other Ambulatory Visit: Payer: Self-pay | Admitting: Family Medicine

## 2014-12-25 ENCOUNTER — Telehealth: Payer: Self-pay | Admitting: Medical

## 2014-12-25 LAB — HSV 2 ANTIBODY, IGG

## 2014-12-25 LAB — HSV(HERPES SIMPLEX VRS) I + II AB-IGM: Herpes Simplex Vrs I&II-IgM Ab (EIA): 1.37 INDEX — ABNORMAL HIGH

## 2014-12-25 LAB — HSV 1 ANTIBODY, IGG: HSV 1 Glycoprotein G Ab, IgG: 8.99 IV — ABNORMAL HIGH

## 2014-12-25 MED ORDER — TESTOSTERONE 20.25 MG/ACT (1.62%) TD GEL: 2.0000 | Freq: Once | TRANSDERMAL | Status: DC

## 2014-12-25 NOTE — Telephone Encounter (Signed)
Still pending some labs. His level was way to high.  Go to 1 pump per shoulder per day.  Call out refill for 1 pump per shoulder daily, 2 pumps total daily and needs to come by for nurse visit for repeat TST lab in 4-6wk.  Please put in electronic order for this

## 2014-12-25 NOTE — Telephone Encounter (Signed)
Pt called to see if lab result were in yet so that he could get refill on Androgel refill today if possoble since he is out of this med

## 2014-12-28 NOTE — Telephone Encounter (Signed)
pATIENT IS AWARE OF SHANE TYSINGER PA MESSAGE AND I CALLED THE PHARMACY AND CALLED OUT ADDITIONAL REFILLS ON HIS ANDROGEL

## 2015-03-29 ENCOUNTER — Other Ambulatory Visit: Payer: Self-pay | Admitting: Medical

## 2015-03-29 NOTE — Telephone Encounter (Signed)
Phoned in.

## 2015-03-29 NOTE — Telephone Encounter (Signed)
Is this okay to call in? 

## 2015-03-29 NOTE — Telephone Encounter (Signed)
pls call out Androgel

## 2015-04-07 ENCOUNTER — Other Ambulatory Visit: Payer: BLUE CROSS/BLUE SHIELD

## 2015-04-07 ENCOUNTER — Other Ambulatory Visit: Payer: Self-pay | Admitting: Internal Medicine

## 2015-04-07 DIAGNOSIS — E291 Testicular hypofunction: Secondary | ICD-10-CM

## 2015-04-08 ENCOUNTER — Other Ambulatory Visit: Payer: Self-pay

## 2015-04-08 LAB — TESTOSTERONE: TESTOSTERONE: 419 ng/dL (ref 300–890)

## 2015-04-08 MED ORDER — OMEPRAZOLE 40 MG PO CPDR: 40.0000 mg | DELAYED_RELEASE_CAPSULE | Freq: Every day | ORAL | Status: DC

## 2015-04-08 MED ORDER — TESTOSTERONE 20.25 MG/ACT (1.62%) TD GEL: TRANSDERMAL | Status: DC

## 2015-04-08 MED ORDER — MIRTAZAPINE 15 MG PO TABS: 15.0000 mg | ORAL_TABLET | Freq: Every day | ORAL | Status: DC

## 2015-04-21 ENCOUNTER — Encounter: Payer: Self-pay | Admitting: Medical

## 2015-04-22 ENCOUNTER — Telehealth: Payer: Self-pay | Admitting: Medical

## 2015-04-22 NOTE — Telephone Encounter (Signed)
The level was in the 400s which is fine.

## 2015-04-22 NOTE — Telephone Encounter (Signed)
Pt called and stated his cpe was rescheduled from the other day and he needs his lab results for testosterone. He did reschedule his cpe. Please call pt at [phone].

## 2015-04-22 NOTE — Telephone Encounter (Signed)
We can discuss at his upcoming physical.  Please reschedule his physical

## 2015-04-22 NOTE — Telephone Encounter (Signed)
Colin Sutton, patient was using 4 pumps 4 months ago and now is using 2 pumps. He felt much better on a higher does, if agreeable with you he would like to increase to 3 pumps a day. He has AEX 05/05/15 and is already out of testosterone, would you refill at [facility]?

## 2015-04-22 NOTE — Telephone Encounter (Signed)
LM to CB

## 2015-04-22 NOTE — Telephone Encounter (Signed)
Pt needs to be called with lab results. Clinical call sent up front.

## 2015-04-22 NOTE — Telephone Encounter (Signed)
As per message he has rescheduled his cpe but he wants his lab results. He states he doesn't feel that medication is working correctly.

## 2015-04-23 ENCOUNTER — Telehealth: Payer: Self-pay

## 2015-04-23 MED ORDER — TESTOSTERONE 20.25 MG/ACT (1.62%) TD GEL: TRANSDERMAL | Status: DC

## 2015-04-23 NOTE — Telephone Encounter (Signed)
Pt at pharmacy going to call in presc,

## 2015-04-23 NOTE — Telephone Encounter (Signed)
Call out 30 day supply at 3 pumps per day

## 2015-04-23 NOTE — Telephone Encounter (Signed)
See other message about refill. 

## 2015-04-23 NOTE — Telephone Encounter (Signed)
Pt called, she thought his next Androgel prescription refill was supposed to be a higher dose than it's been and wants to know if this is correct.

## 2015-04-23 NOTE — Telephone Encounter (Signed)
Dosage was changed per Shane's instructions and called into pharmacy  1 bottle is 75g which will be 20 days supply, pharmacist will try and run 2 bottles 150g for 40 days supply

## 2015-04-23 NOTE — Telephone Encounter (Signed)
Colin Sutton has called this med out to pharmacy

## 2015-05-05 ENCOUNTER — Encounter: Payer: Self-pay | Admitting: Medical

## 2015-05-05 ENCOUNTER — Ambulatory Visit (INDEPENDENT_AMBULATORY_CARE_PROVIDER_SITE_OTHER): Payer: BLUE CROSS/BLUE SHIELD | Admitting: Medical

## 2015-05-05 VITALS — BP 140/92 | HR 80 | Temp 97.9°F | Resp 18 | Ht 74.0 in | Wt 281.2 lb

## 2015-05-05 DIAGNOSIS — K648 Other hemorrhoids: Secondary | ICD-10-CM | POA: Diagnosis not present

## 2015-05-05 DIAGNOSIS — E669 Obesity, unspecified: Secondary | ICD-10-CM | POA: Insufficient documentation

## 2015-05-05 DIAGNOSIS — N528 Other male erectile dysfunction: Secondary | ICD-10-CM

## 2015-05-05 DIAGNOSIS — G4700 Insomnia, unspecified: Secondary | ICD-10-CM | POA: Diagnosis not present

## 2015-05-05 DIAGNOSIS — K219 Gastro-esophageal reflux disease without esophagitis: Secondary | ICD-10-CM

## 2015-05-05 DIAGNOSIS — M5136 Other intervertebral disc degeneration, lumbar region: Secondary | ICD-10-CM | POA: Diagnosis not present

## 2015-05-05 DIAGNOSIS — Z79899 Other long term (current) drug therapy: Secondary | ICD-10-CM

## 2015-05-05 DIAGNOSIS — E291 Testicular hypofunction: Secondary | ICD-10-CM | POA: Diagnosis not present

## 2015-05-05 DIAGNOSIS — F902 Attention-deficit hyperactivity disorder, combined type: Secondary | ICD-10-CM | POA: Diagnosis not present

## 2015-05-05 DIAGNOSIS — Z0000 Encounter for general adult medical examination without abnormal findings: Secondary | ICD-10-CM

## 2015-05-05 DIAGNOSIS — K644 Residual hemorrhoidal skin tags: Secondary | ICD-10-CM

## 2015-05-05 DIAGNOSIS — R030 Elevated blood-pressure reading, without diagnosis of hypertension: Secondary | ICD-10-CM | POA: Diagnosis not present

## 2015-05-05 DIAGNOSIS — M51369 Other intervertebral disc degeneration, lumbar region without mention of lumbar back pain or lower extremity pain: Secondary | ICD-10-CM | POA: Insufficient documentation

## 2015-05-05 DIAGNOSIS — Z125 Encounter for screening for malignant neoplasm of prostate: Secondary | ICD-10-CM

## 2015-05-05 DIAGNOSIS — Z2821 Immunization not carried out because of patient refusal: Secondary | ICD-10-CM

## 2015-05-05 LAB — POCT URINALYSIS DIPSTICK
Bilirubin, UA: NEGATIVE
Glucose, UA: NEGATIVE
KETONES UA: NEGATIVE
Leukocytes, UA: NEGATIVE
Nitrite, UA: NEGATIVE
PH UA: 5.5
RBC UA: NEGATIVE
Urobilinogen, UA: NEGATIVE

## 2015-05-05 LAB — LIPID PANEL
CHOL/HDL RATIO: 5.3 ratio — AB (ref <=5.0)
Cholesterol: 230 mg/dL — ABNORMAL HIGH (ref 125–200)
HDL: 43 mg/dL (ref >=40)
Triglycerides: 408 mg/dL — ABNORMAL HIGH (ref <150)

## 2015-05-05 MED ORDER — VARDENAFIL HCL 20 MG PO TABS: 20.0000 mg | ORAL_TABLET | Freq: Every day | ORAL | Status: DC | PRN

## 2015-05-05 MED ORDER — HYDROCORTISONE 2.5 % RE CREA: 1.0000 "application " | TOPICAL_CREAM | Freq: Two times a day (BID) | RECTAL | Status: DC

## 2015-05-05 MED ORDER — MIRTAZAPINE 15 MG PO TABS: 15.0000 mg | ORAL_TABLET | Freq: Every day | ORAL | Status: AC

## 2015-05-05 MED ORDER — OMEPRAZOLE 40 MG PO CPDR: 40.0000 mg | DELAYED_RELEASE_CAPSULE | Freq: Every day | ORAL | Status: AC

## 2015-05-05 NOTE — Progress Notes (Signed)
Subjective:   HPI  Colin Sutton is a 49 y.o. male who presents for a complete physical.  Medical care team includes:  Dr. Ethelene Hal, [facility] for back pain  Dr. Evelene Croon, psychiatry  Kristian Covey, PA-C here for primary care   Concerns: Overall doing well.  At last lab for TST he increased to 3 pumps daily.  No c/o otherwise.  Reviewed their medical, surgical, family, social, medication, and allergy history and updated chart as appropriate.  Past Medical History  Diagnosis Date  . Seasonal allergic rhinitis   . GERD (gastroesophageal reflux disease)   . Rash     intermittent  . MRSA infection 06/2009    [facility], 28 day hospitalization, started with abrasion to knee  . ADHD (attention deficit hyperactivity disorder)     Dr. Evelene Croon  . Lumbar degenerative disc disease     Dr. Ethelene Hal, [facility]  . 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 Asthma     PFT 04/08/13 normal spirometry flows with bronchodilator response, normal lung volumes and normal diffusion  . Anxiety     Dr. Evelene Croon    Past Surgical History  Procedure Laterality Date  . Carpal tunnel release      bilat  . Ulnar tunnel release      right arm  . Peripherally inserted central catheter insertion  06/2009    Vancomycin for MRSA infection     Social History   Social History  . Marital Status: Married    Spouse Name: N/A  . Number of Children: N/A  . Years of Education: N/A   Occupational History  . Not on file.   Social History Main Topics  . Smoking status: Never Smoker   . Smokeless tobacco: Not on file  . Alcohol Use: No  . Drug Use: No  . Sexual Activity: Not on file   Other Topics Concern  . 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   Single.   Has 2 chlidren.  Christian faith.  As of 04/2015    Family History  Problem Relation Age of Onset  . COPD Mother   . Diabetes Mother   . Heart disease Father 86  . Diabetes Father    . Asthma Brother   . Cancer Neg Hx   . Stroke Neg Hx      Current outpatient prescriptions:  .  ALPRAZolam (XANAX) 1 MG tablet, Take 1 mg by mouth 4 (four) times daily., Disp: , Rfl:  .  dextroamphetamine (DEXEDRINE SPANSULE) 10 MG 24 hr capsule, Take 10 mg by mouth 6 (six) times daily., Disp: , Rfl:  .  mirtazapine (REMERON) 15 MG tablet, Take 1 tablet (15 mg total) by mouth at bedtime., Disp: 90 tablet, Rfl: 1 .  omeprazole (PRILOSEC) 40 MG capsule, Take 1 capsule (40 mg total) by mouth daily., Disp: 90 capsule, Rfl: 11 .  oxyCODONE (ROXICODONE) 15 MG immediate release tablet, Take 30 mg by mouth every 4 (four) hours as needed for pain. , Disp: , Rfl:  .  Testosterone (ANDROGEL PUMP) 20.25 MG/ACT (1.62%) GEL, APPLY 3 pumps per day, Disp: 150 g, Rfl: 0 .  vardenafil (LEVITRA) 20 MG tablet, Take 1 tablet (20 mg total) by mouth daily as needed for erectile dysfunction., Disp: 10 tablet, Rfl: 2 .  hydrocortisone (PROCTOSOL HC) 2.5 % rectal cream, Place 1 application rectally 2 (two) times daily., Disp: 30 g, Rfl: 0  Allergies  Allergen Reactions  . Acetaminophen     GI upset, diarrhea  . Morphine And Related     Sick, dizzy, nausea, vomiting    Review of Systems Constitutional: -fever, -chills, -sweats, -unexpected weight change, -decreased appetite, -fatigue Allergy: -sneezing, -itching, -congestion Dermatology: -changing moles, --rash, -lumps ENT: -runny nose, -ear pain, -sore throat, -hoarseness, -sinus pain, -teeth pain, - ringing in ears, -hearing loss, -nosebleeds Cardiology: -chest pain, -palpitations, -swelling, -difficulty breathing when lying flat, -waking up short of breath Respiratory: -cough, -shortness of breath, -difficulty breathing with exercise or exertion, -wheezing, -coughing up blood Gastroenterology: -abdominal pain, -nausea, -vomiting, -diarrhea, -constipation, -blood in stool, -changes in bowel movement, -difficulty swallowing or eating Hematology: -bleeding,  -bruising  Musculoskeletal: -joint aches, -muscle aches, -joint swelling, -back pain, -neck pain, -cramping, -changes in gait Ophthalmology: denies vision changes, eye redness, itching, discharge Urology: -burning with urination, -difficulty urinating, -blood in urine, -urinary frequency, -urgency, -incontinence Neurology: -headache, -weakness, -tingling, -numbness, -memory loss, -falls, -dizziness Psychology: -depressed mood, -agitation, -sleep problems     Objective:   Physical Exam  BP 140/92 mmHg  Pulse 80  Temp(Src) 97.9 F (36.6 C) (Oral)  Resp 18  Ht  (1.88 m)  Wt 281 lb 3.2 oz (127.551 kg)  BMI 36.09 kg/m2  BP Readings from Last 3 Encounters:  05/05/15 140/92  12/23/14 112/82  03/30/14 130/84   Wt Readings from Last 3 Encounters:  05/05/15 281 lb 3.2 oz (127.551 kg)  12/23/14 284 lb (128.822 kg)  03/30/14 255 lb (115.667 kg)   Gen: wd, wn, nad, obese white male Skin: left upper anterior thigh with horizontal scar, right forehead above eyebrow with vertical scar, few scattered macules, right lateral buttock with large pedunculated skin tag, no worrisome lesions  HEENT: normocephalic, conjunctiva/corneas normal, sclerae anicteric, PERRLA, EOMi, nares patent, no discharge or erythema, pharynx normal  Oral cavity: MMM, tongue normal, teeth with significant plaque  Neck: supple, no lymphadenopathy, no thyromegaly, no masses, normal ROM, no bruits  Chest: non tender, normal shape and expansion  Heart: RRR, normal S1, S2, no murmurs  Lungs: CTA bilaterally, no wheezes, rhonchi, or rales  Abdomen: +bs, soft, non tender, non distended, no masses, no hepatomegaly, no splenomegaly, no bruits  Back: non tender, normal ROM, no scoliosis  Musculoskeletal:  upper extremities non tender, no obvious deformity, normal ROM throughout, lower extremities non tender, no obvious deformity, normal ROM throughout  Extremities: no edema, no cyanosis, no clubbing  Pulses: 2+ symmetric,  upper and lower extremities, normal cap refill  Neurological: alert, oriented x 3, CN2-12 intact, strength normal upper extremities and lower extremities, sensation normal throughout, DTRs 2+ throughout, no cerebellar signs, gait normal  Psychiatric: normal affect, behavior normal, pleasant  GU: normal male external genitalia, circumcised, hypogonadal left testicle, otherwise non tender, no masses, no hernia, no lymphadenopathy Rectal: small left sided external hemorrhoid nontneder, non bleeding, small internal hemorrhoids palpated, prostate WNL, no nodules, occult negative stool   Assessment and Plan :    Encounter Diagnoses  Name Primary?  . Encounter for health maintenance examination in adult Yes  . Hypogonadism in male   . High risk medication use   . Screening for prostate cancer   . External hemorrhoid   . Elevated blood-pressure reading without diagnosis of hypertension   . Obesity   . Other male erectile dysfunction   . Gastroesophageal reflux disease without esophagitis   . Insomnia   . DDD (degenerative disc disease), lumbar   . Attention deficit hyperactivity  disorder (ADHD), combined type   . Influenza vaccination declined    Physical exam - discussed healthy lifestyle, diet, exercise, preventative care, vaccinations, and addressed their concerns.   Labs today, doing well on current medications.  reviewed recent labs.  Advised he see dentist and eye doctor yearly.   Refilled medications. hypogonadism - labs today, c/t 3 pumps daily, discussed risks/benefits of medication PSA today external hemorrhoid - dicussed SITZ baths and proctosol cream this week Monitor BPs, advised he working on losing back down to 260 lb. ADHD managed by psych Lumbar DDD managed by ortho Follow-up pending labs

## 2015-05-05 NOTE — Addendum Note (Signed)
Addended by: Blase Mess F on: 05/05/2015 02:59 PM   Modules accepted: Orders

## 2015-05-06 ENCOUNTER — Other Ambulatory Visit: Payer: Self-pay | Admitting: Medical

## 2015-05-06 LAB — TESTOSTERONE: TESTOSTERONE: 285 ng/dL — AB (ref 300–890)

## 2015-05-06 LAB — HEMOGLOBIN A1C
HEMOGLOBIN A1C: 5.4 % (ref <5.7)
MEAN PLASMA GLUCOSE: 108 mg/dL (ref <117)

## 2015-05-06 LAB — PSA: PSA: 0.52 ng/mL (ref <=4.00)

## 2015-05-06 MED ORDER — TESTOSTERONE 20.25 MG/ACT (1.62%) TD GEL: TRANSDERMAL | Status: DC

## 2015-05-06 MED ORDER — OMEGA-3-ACID ETHYL ESTERS 1 G PO CAPS: 2.0000 g | ORAL_CAPSULE | Freq: Two times a day (BID) | ORAL | Status: DC

## 2015-05-15 IMAGING — CR DG CHEST 2V
2 series · 2 of 2 positions shown · non-contrast
Comparison: 07/24/2006

CLINICAL DATA: Cough, asthma

CHEST - 2 VIEW

[view not recorded (1 of 2)]
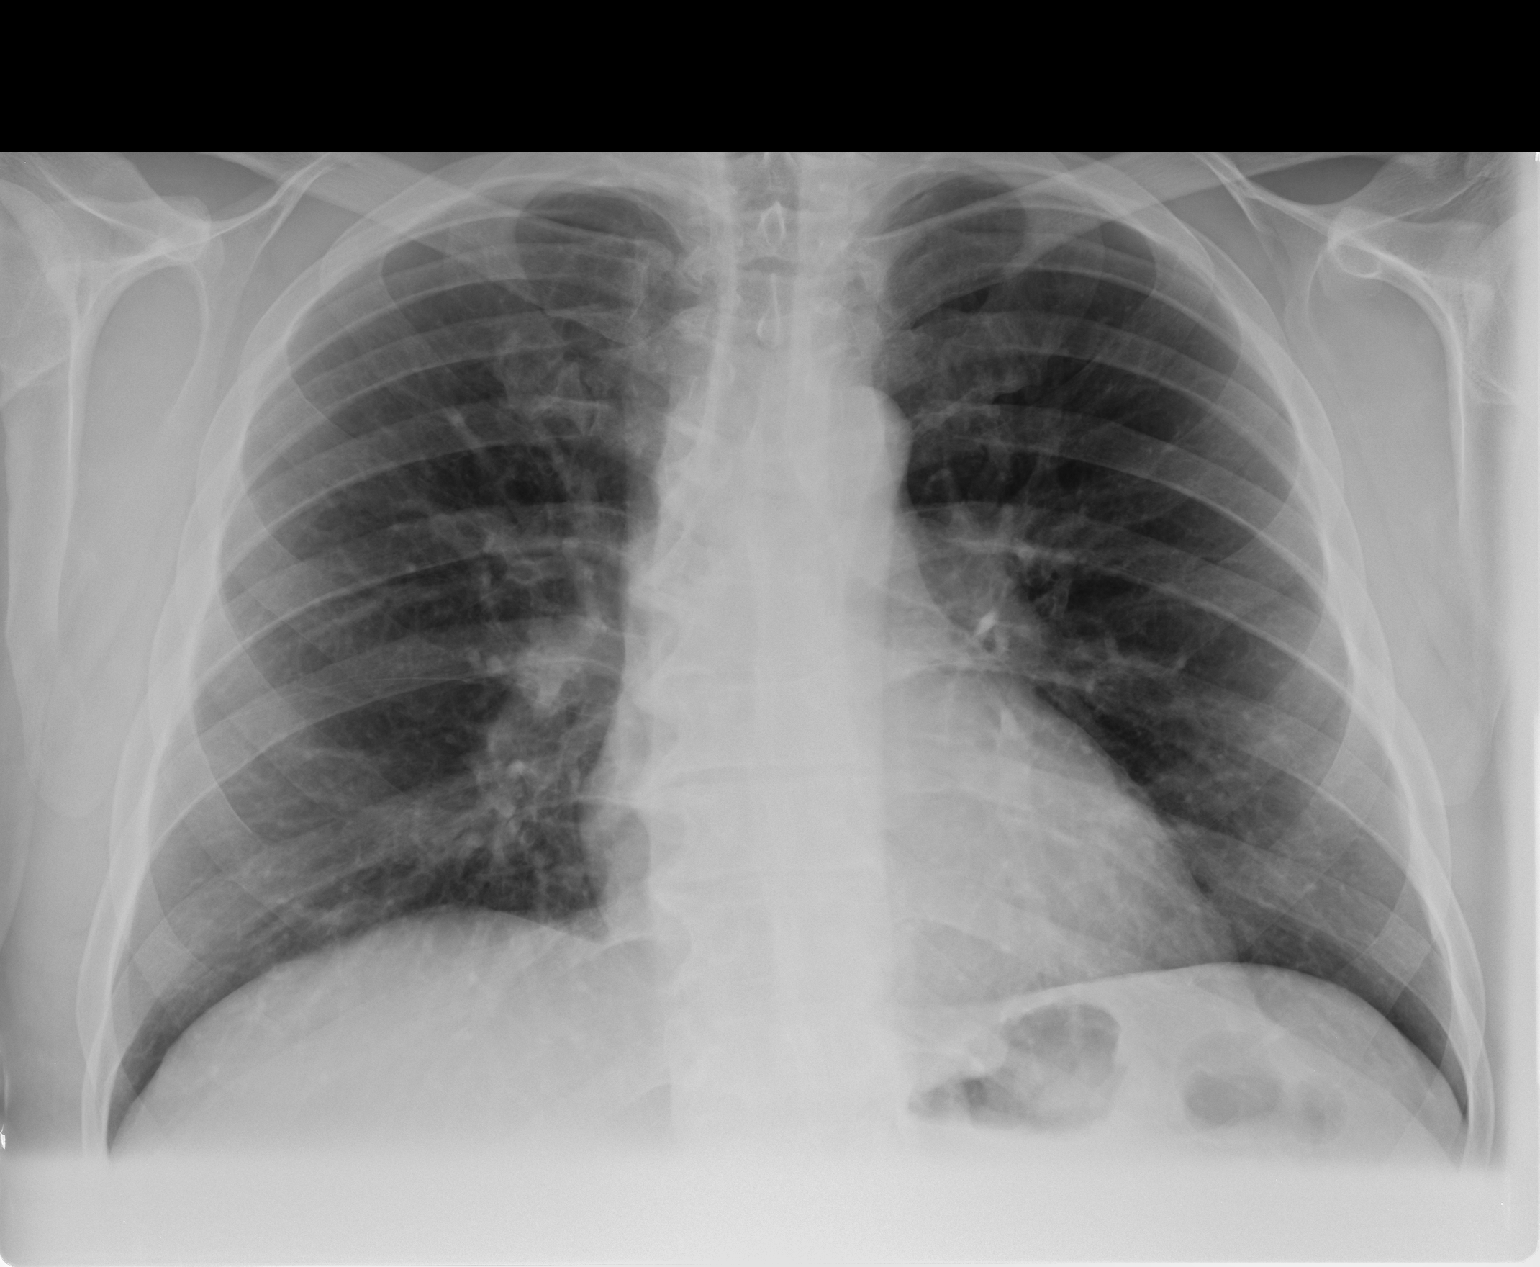

[view not recorded (2 of 2)]
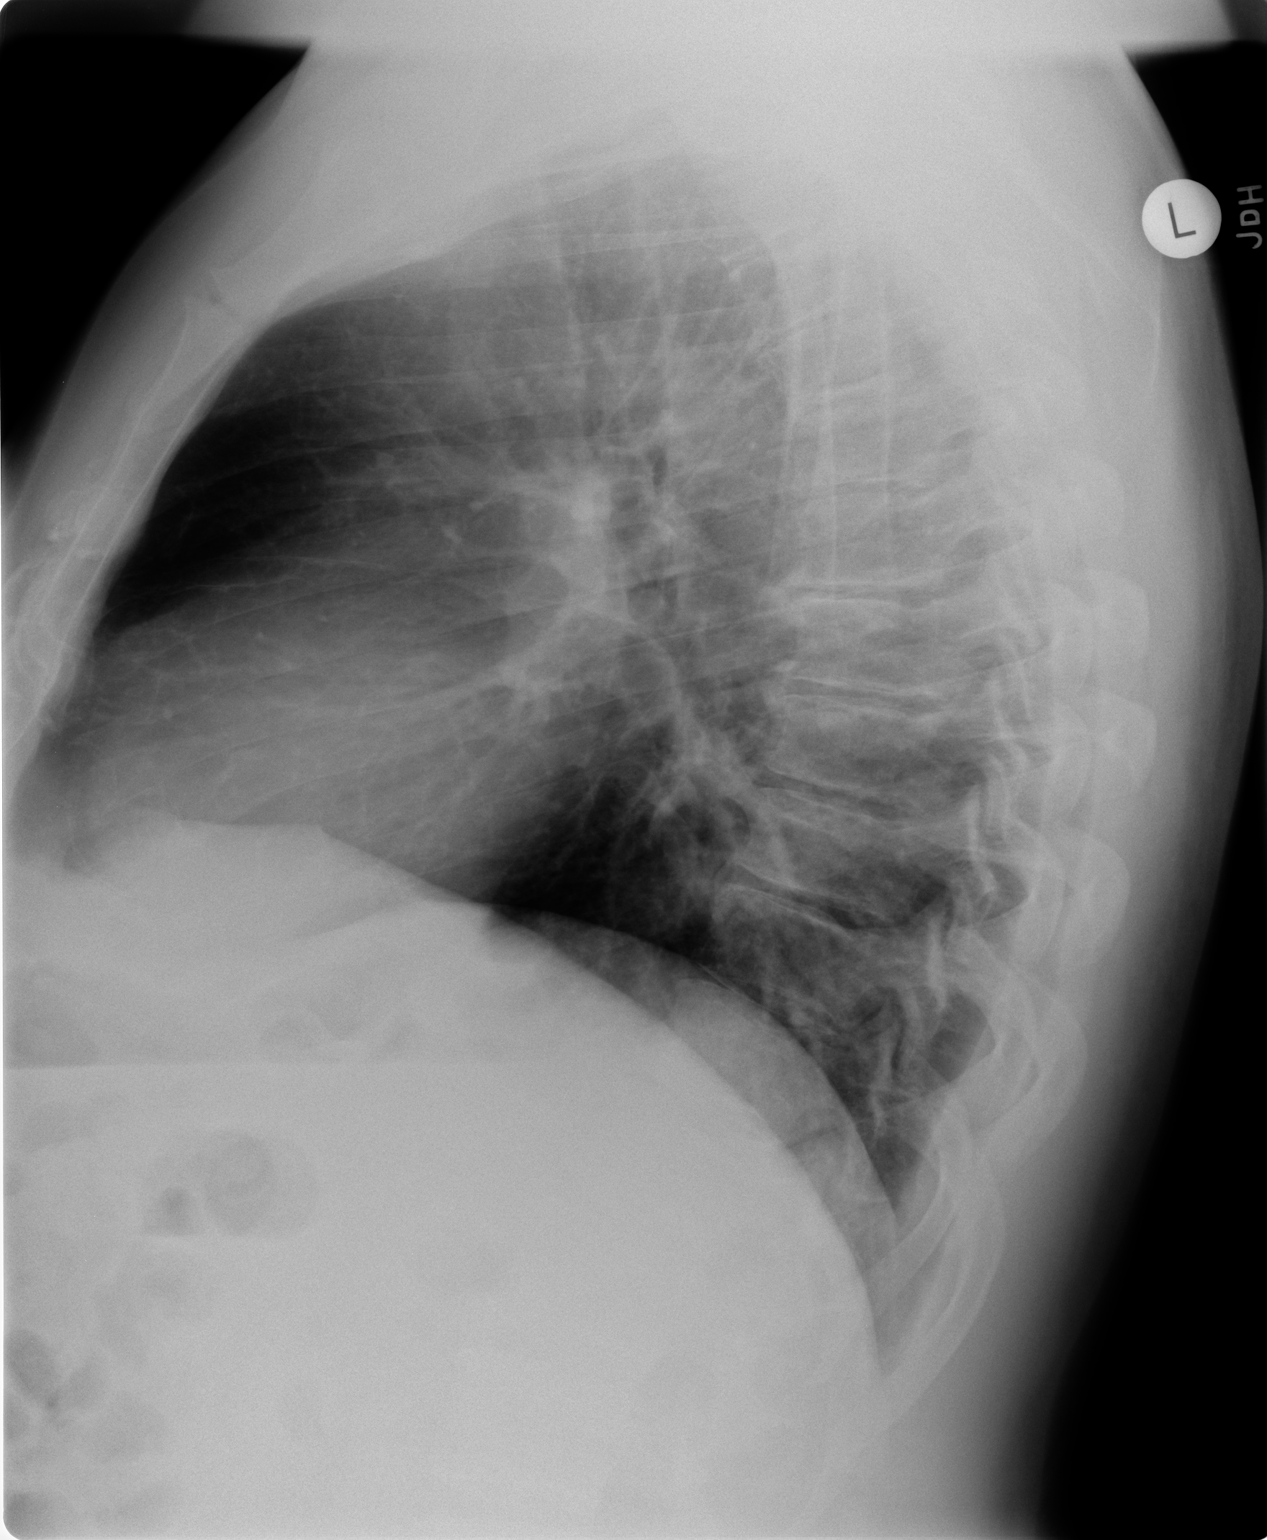

[2 of 2 positions shown; findings below may reference images not displayed]

FINDINGS: The heart and pulmonary vascularity are within normal
limits.  The lungs are clear bilaterally.  No acute bony
abnormality is seen.
IMPRESSION: No acute abnormality noted.

## 2015-09-14 ENCOUNTER — Other Ambulatory Visit: Payer: Self-pay | Admitting: Medical

## 2015-09-14 ENCOUNTER — Telehealth: Payer: Self-pay | Admitting: Medical

## 2015-09-14 MED ORDER — SILDENAFIL CITRATE 100 MG PO TABS: ORAL_TABLET | ORAL | Status: DC

## 2015-09-14 NOTE — Telephone Encounter (Signed)
Pt called and was requesting a cheaper pill said he talked to [employer] and said that with his insurance and said that he would like the  generic viagra said he was going to be cheaper that the levitra. Said with the insurance now the levitra it was 80. And needs something cheaper. Pt uses [facility] - [place], [place] - [address] and can be reached at [phone] if any questions.

## 2015-09-14 NOTE — Telephone Encounter (Signed)
Viagra sent

## 2015-09-15 NOTE — Telephone Encounter (Signed)
Called and left message that it was done!!

## 2015-10-14 ENCOUNTER — Telehealth: Payer: Self-pay

## 2015-10-14 NOTE — Telephone Encounter (Signed)
Pt called stating that his pharmacy does not carry viagra in  only  and wants to know if you can send it in differently for him?

## 2015-10-15 ENCOUNTER — Other Ambulatory Visit: Payer: Self-pay | Admitting: Medical

## 2015-10-15 MED ORDER — SILDENAFIL CITRATE 20 MG PO TABS: ORAL_TABLET | ORAL | Status: DC

## 2015-10-15 NOTE — Telephone Encounter (Signed)
Pt is aware.  

## 2015-10-15 NOTE — Telephone Encounter (Signed)
Med sent.

## 2015-10-27 ENCOUNTER — Telehealth: Payer: Self-pay | Admitting: Medical

## 2015-10-27 NOTE — Telephone Encounter (Signed)
Received insurance med alert. Review at next appt

## 2015-11-30 ENCOUNTER — Other Ambulatory Visit: Payer: Self-pay | Admitting: Medical

## 2015-11-30 NOTE — Telephone Encounter (Signed)
Is this ok to refill?  

## 2015-12-02 ENCOUNTER — Telehealth: Payer: Self-pay | Admitting: *Deleted

## 2015-12-02 ENCOUNTER — Encounter: Payer: Self-pay | Admitting: Medical

## 2015-12-02 MED ORDER — TESTOSTERONE 20.25 MG/ACT (1.62%) TD GEL: TRANSDERMAL | Status: DC

## 2015-12-02 NOTE — Telephone Encounter (Signed)
Called in and pt already rescheduled

## 2015-12-02 NOTE — Telephone Encounter (Signed)
Patient's fiance called and patient had to cancel today's appt-rescheduled for Monday 4/24 needs to know if you can call in testosterone as he is out.

## 2015-12-02 NOTE — Telephone Encounter (Signed)
Call out 30 day supply and reschedule

## 2015-12-06 ENCOUNTER — Encounter: Payer: Self-pay | Admitting: Medical

## 2015-12-06 ENCOUNTER — Ambulatory Visit (INDEPENDENT_AMBULATORY_CARE_PROVIDER_SITE_OTHER): Payer: BLUE CROSS/BLUE SHIELD | Admitting: Medical

## 2015-12-06 VITALS — BP 130/84 | HR 98 | Wt 280.0 lb

## 2015-12-06 DIAGNOSIS — Z79899 Other long term (current) drug therapy: Secondary | ICD-10-CM

## 2015-12-06 DIAGNOSIS — N529 Male erectile dysfunction, unspecified: Secondary | ICD-10-CM | POA: Insufficient documentation

## 2015-12-06 DIAGNOSIS — Z125 Encounter for screening for malignant neoplasm of prostate: Secondary | ICD-10-CM

## 2015-12-06 DIAGNOSIS — E291 Testicular hypofunction: Secondary | ICD-10-CM

## 2015-12-06 DIAGNOSIS — N528 Other male erectile dysfunction: Secondary | ICD-10-CM | POA: Diagnosis not present

## 2015-12-06 DIAGNOSIS — Z1211 Encounter for screening for malignant neoplasm of colon: Secondary | ICD-10-CM | POA: Diagnosis not present

## 2015-12-06 MED ORDER — SILDENAFIL CITRATE 20 MG PO TABS: ORAL_TABLET | ORAL | Status: DC
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
## 2015-12-07 ENCOUNTER — Other Ambulatory Visit: Payer: Self-pay | Admitting: Medical

## 2015-12-07 LAB — COMPREHENSIVE METABOLIC PANEL
ALT: 19 U/L (ref 9–46)
AST: 15 U/L (ref 10–35)
Albumin: 4.2 g/dL (ref 3.6–5.1)
Alkaline Phosphatase: 50 U/L (ref 40–115)
BUN: 11 mg/dL (ref 7–25)
CHLORIDE: 103 mmol/L (ref 98–110)
CO2: 25 mmol/L (ref 20–31)
CREATININE: 0.93 mg/dL (ref 0.70–1.33)
Calcium: 8.9 mg/dL (ref 8.6–10.3)
GLUCOSE: 106 mg/dL — AB (ref 65–99)
POTASSIUM: 4.3 mmol/L (ref 3.5–5.3)
SODIUM: 139 mmol/L (ref 135–146)
Total Bilirubin: 0.6 mg/dL (ref 0.2–1.2)
Total Protein: 6.8 g/dL (ref 6.1–8.1)

## 2015-12-07 LAB — CBC
HEMATOCRIT: 46.8 % (ref 38.5–50.0)
HEMOGLOBIN: 15.6 g/dL (ref 13.2–17.1)
MCH: 28.7 pg (ref 27.0–33.0)
MCHC: 33.3 g/dL (ref 32.0–36.0)
MCV: 86 fL (ref 80.0–100.0)
MPV: 10.3 fL (ref 7.5–12.5)
Platelets: 339 10*3/uL (ref 140–400)
RBC: 5.44 MIL/uL (ref 4.20–5.80)
RDW: 14.7 % (ref 11.0–15.0)
WBC: 6.8 10*3/uL (ref 4.0–10.5)

## 2015-12-07 LAB — PSA: PSA: 0.62 ng/mL (ref <=4.00)

## 2015-12-07 LAB — TESTOSTERONE: Testosterone: 651 ng/dL (ref 250–827)

## 2015-12-07 MED ORDER — TESTOSTERONE 20.25 MG/ACT (1.62%) TD GEL: TRANSDERMAL | Status: DC

## 2015-12-17 ENCOUNTER — Encounter: Payer: Self-pay | Admitting: Internal Medicine

## 2016-05-05 ENCOUNTER — Other Ambulatory Visit: Payer: Self-pay | Admitting: Medical

## 2016-06-15 ENCOUNTER — Telehealth: Payer: Self-pay

## 2016-06-15 NOTE — Telephone Encounter (Signed)
Needs an appointment but don't let him run out

## 2016-06-15 NOTE — Telephone Encounter (Signed)
Pt request refill of Androgel to [facility]

## 2016-06-16 ENCOUNTER — Other Ambulatory Visit: Payer: Self-pay

## 2016-06-16 MED ORDER — TESTOSTERONE 20.25 MG/ACT (1.62%) TD GEL: TRANSDERMAL | 0 refills | Status: DC

## 2016-06-16 NOTE — Telephone Encounter (Signed)
Pt has appointment called med in

## 2016-07-18 ENCOUNTER — Encounter: Payer: Self-pay | Admitting: Medical

## 2016-07-18 ENCOUNTER — Ambulatory Visit: Payer: Self-pay | Admitting: Medical

## 2016-07-18 ENCOUNTER — Ambulatory Visit (INDEPENDENT_AMBULATORY_CARE_PROVIDER_SITE_OTHER): Payer: BLUE CROSS/BLUE SHIELD | Admitting: Medical

## 2016-07-18 VITALS — BP 122/70 | HR 80 | Wt 268.8 lb

## 2016-07-18 DIAGNOSIS — K429 Umbilical hernia without obstruction or gangrene: Secondary | ICD-10-CM

## 2016-07-18 DIAGNOSIS — S60511A Abrasion of right hand, initial encounter: Secondary | ICD-10-CM

## 2016-07-18 DIAGNOSIS — E781 Pure hyperglyceridemia: Secondary | ICD-10-CM | POA: Diagnosis not present

## 2016-07-18 DIAGNOSIS — R030 Elevated blood-pressure reading, without diagnosis of hypertension: Secondary | ICD-10-CM | POA: Diagnosis not present

## 2016-07-18 DIAGNOSIS — N529 Male erectile dysfunction, unspecified: Secondary | ICD-10-CM | POA: Diagnosis not present

## 2016-07-18 DIAGNOSIS — Z79899 Other long term (current) drug therapy: Secondary | ICD-10-CM

## 2016-07-18 DIAGNOSIS — E291 Testicular hypofunction: Secondary | ICD-10-CM | POA: Diagnosis not present

## 2016-07-18 LAB — COMPREHENSIVE METABOLIC PANEL
ALBUMIN: 4 g/dL (ref 3.6–5.1)
ALT: 19 U/L (ref 9–46)
AST: 22 U/L (ref 10–35)
Alkaline Phosphatase: 49 U/L (ref 40–115)
BILIRUBIN TOTAL: 0.6 mg/dL (ref 0.2–1.2)
BUN: 13 mg/dL (ref 7–25)
CHLORIDE: 104 mmol/L (ref 98–110)
CO2: 26 mmol/L (ref 20–31)
CREATININE: 0.93 mg/dL (ref 0.70–1.33)
Calcium: 8.9 mg/dL (ref 8.6–10.3)
GLUCOSE: 109 mg/dL — AB (ref 65–99)
Potassium: 4.4 mmol/L (ref 3.5–5.3)
SODIUM: 138 mmol/L (ref 135–146)
Total Protein: 6.6 g/dL (ref 6.1–8.1)

## 2016-07-18 LAB — CBC
HCT: 41.5 % (ref 38.5–50.0)
HEMOGLOBIN: 13.7 g/dL (ref 13.2–17.1)
MCH: 28.7 pg (ref 27.0–33.0)
MCHC: 33 g/dL (ref 32.0–36.0)
MCV: 86.8 fL (ref 80.0–100.0)
MPV: 10.6 fL (ref 7.5–12.5)
PLATELETS: 229 10*3/uL (ref 140–400)
RBC: 4.78 MIL/uL (ref 4.20–5.80)
RDW: 14.1 % (ref 11.0–15.0)
WBC: 8.9 10*3/uL (ref 4.0–10.5)

## 2016-07-18 LAB — LIPID PANEL
Cholesterol: 152 mg/dL (ref <200)
HDL: 42 mg/dL (ref >40)
LDL CALC: 86 mg/dL (ref <100)
TRIGLYCERIDES: 120 mg/dL (ref <150)
Total CHOL/HDL Ratio: 3.6 Ratio (ref <5.0)
VLDL: 24 mg/dL (ref <30)
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
## 2016-07-29 ENCOUNTER — Other Ambulatory Visit: Payer: Self-pay | Admitting: Medical

## 2016-08-03 ENCOUNTER — Other Ambulatory Visit: Payer: Self-pay

## 2016-08-03 ENCOUNTER — Telehealth: Payer: Self-pay

## 2016-08-03 MED ORDER — TESTOSTERONE 20.25 MG/ACT (1.62%) TD GEL: TRANSDERMAL | 2 refills | Status: DC

## 2016-08-03 NOTE — Telephone Encounter (Signed)
Needs refill of Androgel called into to [facility] please

## 2016-08-03 NOTE — Telephone Encounter (Signed)
Call in refill to pharmacy

## 2016-08-12 ENCOUNTER — Other Ambulatory Visit: Payer: Self-pay | Admitting: Medical

## 2016-09-08 ENCOUNTER — Other Ambulatory Visit: Payer: Self-pay | Admitting: Medical

## 2016-09-08 NOTE — Telephone Encounter (Signed)
Is this okay to refill? 

## 2016-11-02 ENCOUNTER — Other Ambulatory Visit: Payer: Self-pay | Admitting: Medical

## 2016-11-02 NOTE — Telephone Encounter (Signed)
Is this okay to refill? 

## 2016-11-03 ENCOUNTER — Other Ambulatory Visit: Payer: BLUE CROSS/BLUE SHIELD

## 2016-11-03 DIAGNOSIS — E291 Testicular hypofunction: Secondary | ICD-10-CM

## 2016-11-03 DIAGNOSIS — N529 Male erectile dysfunction, unspecified: Secondary | ICD-10-CM

## 2016-11-03 DIAGNOSIS — Z79899 Other long term (current) drug therapy: Secondary | ICD-10-CM

## 2016-11-03 NOTE — Telephone Encounter (Signed)
He was suppose to come in for lab visit from last visit regarding testosterone.  My last TST level was a year ago although he was here in 07/2016.  Verify he is taking TST, and needs to come in for lab visit for TST before I can c/t medication

## 2016-11-03 NOTE — Telephone Encounter (Signed)
Pt is coming today to have his labs done.

## 2016-11-04 LAB — TESTOSTERONE: TESTOSTERONE: 929 ng/dL — AB (ref 250–827)

## 2016-11-06 ENCOUNTER — Other Ambulatory Visit: Payer: Self-pay

## 2016-11-06 MED ORDER — TESTOSTERONE 20.25 MG/ACT (1.62%) TD GEL: TRANSDERMAL | 1 refills | Status: DC

## 2016-11-06 NOTE — Telephone Encounter (Signed)
See other message

## 2016-12-05 ENCOUNTER — Other Ambulatory Visit: Payer: Self-pay | Admitting: Medical

## 2016-12-05 NOTE — Telephone Encounter (Signed)
Is this okay to refill? 

## 2017-02-07 ENCOUNTER — Other Ambulatory Visit: Payer: Self-pay | Admitting: Medical

## 2017-02-07 ENCOUNTER — Other Ambulatory Visit: Payer: Self-pay

## 2017-02-12 ENCOUNTER — Encounter: Payer: Self-pay | Admitting: Medical

## 2017-02-16 ENCOUNTER — Encounter: Payer: Self-pay | Admitting: Medical

## 2017-02-20 ENCOUNTER — Encounter: Payer: BLUE CROSS/BLUE SHIELD | Admitting: Medical

## 2017-02-22 ENCOUNTER — Encounter: Payer: Self-pay | Admitting: Medical

## 2017-02-22 ENCOUNTER — Ambulatory Visit (INDEPENDENT_AMBULATORY_CARE_PROVIDER_SITE_OTHER): Payer: BLUE CROSS/BLUE SHIELD | Admitting: Medical

## 2017-02-22 VITALS — BP 100/60 | HR 76 | Resp 14 | Wt 282.0 lb

## 2017-02-22 DIAGNOSIS — N528 Other male erectile dysfunction: Secondary | ICD-10-CM | POA: Diagnosis not present

## 2017-02-22 DIAGNOSIS — E291 Testicular hypofunction: Secondary | ICD-10-CM | POA: Diagnosis not present

## 2017-02-22 DIAGNOSIS — Z125 Encounter for screening for malignant neoplasm of prostate: Secondary | ICD-10-CM | POA: Diagnosis not present

## 2017-02-22 DIAGNOSIS — Z79899 Other long term (current) drug therapy: Secondary | ICD-10-CM | POA: Diagnosis not present

## 2017-02-22 DIAGNOSIS — R7301 Impaired fasting glucose: Secondary | ICD-10-CM

## 2017-02-22 DIAGNOSIS — E781 Pure hyperglyceridemia: Secondary | ICD-10-CM | POA: Diagnosis not present

## 2017-02-22 DIAGNOSIS — N529 Male erectile dysfunction, unspecified: Secondary | ICD-10-CM

## 2017-02-22 LAB — LIPID PANEL
CHOL/HDL RATIO: 4.4 ratio (ref <5.0)
CHOLESTEROL: 153 mg/dL (ref <200)
HDL: 35 mg/dL — AB (ref >40)
LDL Cholesterol: 69 mg/dL (ref <100)
TRIGLYCERIDES: 245 mg/dL — AB (ref <150)
VLDL: 49 mg/dL — ABNORMAL HIGH (ref <30)

## 2017-02-22 LAB — BASIC METABOLIC PANEL
BUN: 10 mg/dL (ref 7–25)
CO2: 25 mmol/L (ref 20–31)
Calcium: 8.8 mg/dL (ref 8.6–10.3)
Chloride: 104 mmol/L (ref 98–110)
Creat: 0.96 mg/dL (ref 0.70–1.33)
GLUCOSE: 107 mg/dL — AB (ref 65–99)
POTASSIUM: 4.3 mmol/L (ref 3.5–5.3)
Sodium: 137 mmol/L (ref 135–146)

## 2017-02-22 NOTE — Progress Notes (Signed)
Subjective: Chief Complaint  Patient presents with  . Medication Management   Here for med check.   Since last TST check been using 2 pumps daily androgel instead of 3 pumps and can definitely tell difference.  More sluggish, less motivated, thinks his level is low.    He was 4 wheeling this past weekend, had to help someone's vehicle get unstuck.  Since then has mild LE swelling.  Generally doesn't have swelling.   He has recurrent rash on left lower lateral leg, saw dermatology last year for same, was given cream, but it never fully goes away.     No urinary or prostate symptoms, no chest pain, no SOB, no paresthesias.   Past Medical History:  Diagnosis Date  . ADHD (attention deficit hyperactivity disorder)    Dr. Evelene Croon  . Anxiety    Dr. Evelene Croon  . Asthma    PFT 04/08/13 normal spirometry flows with bronchodilator response, normal lung volumes and normal diffusion  . GERD (gastroesophageal reflux disease)   . 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 Lumbar degenerative disc disease    Dr. Ethelene Hal, [facility]  . MRSA infection 06/2009   [facility], 28 day hospitalization, started with abrasion to knee  . Rash    intermittent  . Seasonal allergic rhinitis    Current Outpatient Prescriptions on File Prior to Visit  Medication Sig Dispense Refill  . ALPRAZolam (XANAX) 1 MG tablet Take 1 mg by mouth 4 (four) times daily.    . ANDROGEL PUMP 20.25 MG/ACT (1.62%) GEL APPLY 2 PUMPS DAILY 75 g 1  . dextroamphetamine (DEXEDRINE SPANSULE) 10 MG 24 hr capsule Take 10 mg by mouth 6 (six) times daily.    . hydrocortisone (PROCTOSOL HC) 2.5 % rectal cream Place 1 application rectally 2 (two) times daily. 30 g 0  . mirtazapine (REMERON) 15 MG tablet Take 1 tablet (15 mg total) by mouth at bedtime. 90 tablet 1  . omega-3 acid ethyl esters (LOVAZA) 1 G capsule Take 2 capsules (2 g total) by mouth 2 (two) times daily. 120 capsule 11  . omeprazole (PRILOSEC) 40 MG capsule Take 1 capsule (40 mg total) by  mouth daily. 90 capsule 11  . oxyCODONE (ROXICODONE) 15 MG immediate release tablet Take 30 mg by mouth every 4 (four) hours as needed for pain.     . sildenafil (REVATIO) 20 MG tablet TAKE 1 TO 5 TABLETS PRIOR TO SEXUAL ACTIVITY DAILY AS NEEDED 50 tablet 2   No current facility-administered medications on file prior to visit.    ROS as in subjective   Objective: BP 100/60   Pulse 76   Resp 14   Wt 282 lb (127.9 kg)   SpO2 96%   BMI 36.21 kg/m   Wt Readings from Last 3 Encounters:  02/22/17 282 lb (127.9 kg)  07/18/16 268 lb 12.8 oz (121.9 kg)  12/06/15 280 lb (127 kg)   General appearance: alert, no distress, WD/WN,  Oral cavity: MMM, no lesions Neck: supple, no lymphadenopathy, no thyromegaly, no masses Heart: RRR, normal S1, S2, no murmurs Lungs: CTA bilaterally, no wheezes, rhonchi, or rales Ext: no edema Pulses: 2+ symmetric, upper and lower extremities, normal cap refill    Assessment: Encounter Diagnoses  Name Primary?  . Hypertriglyceridemia Yes  . Other male erectile dysfunction   . Screening for prostate cancer   . High risk medication use   . Erectile dysfunction, unspecified erectile dysfunction type   . Hypogonadism in male   . Abnormal  fasting glucose      Plan: hypertriglyceridemia - labs today, he is taking OTC fish oil, no lovaza  ED, hypogonadism - labs today, he is taking 2 pumps androgel daily but worse symptoms of late  abnormal prior glucose on lab in 07/2016 - labs today  PSA screen today  F/u pending labs  Colin Sutton was seen today for medication management.  Diagnoses and all orders for this visit:  Hypertriglyceridemia -     Lipid panel -     Basic metabolic panel  Other male erectile dysfunction -     PSA -     Testosterone -     Basic metabolic panel  Screening for prostate cancer -     PSA -     Basic metabolic panel  High risk medication use -     PSA -     Basic metabolic panel  Erectile dysfunction, unspecified  erectile dysfunction type -     Basic metabolic panel  Hypogonadism in male -     Testosterone -     Basic metabolic panel  Abnormal fasting glucose -     Hemoglobin A1c -     Basic metabolic panel

## 2017-02-23 ENCOUNTER — Other Ambulatory Visit: Payer: Self-pay | Admitting: Medical

## 2017-02-23 LAB — PSA: PSA: 0.3 ng/mL (ref <=4.0)

## 2017-02-23 LAB — TESTOSTERONE: Testosterone: 212 ng/dL — ABNORMAL LOW (ref 250–827)

## 2017-02-23 LAB — HEMOGLOBIN A1C
Hgb A1c MFr Bld: 4.9 % (ref <5.7)
Mean Plasma Glucose: 94 mg/dL

## 2017-02-23 MED ORDER — TESTOSTERONE 20.25 MG/ACT (1.62%) TD GEL: TRANSDERMAL | 3 refills | Status: DC

## 2017-02-23 MED ORDER — OMEGA-3-ACID ETHYL ESTERS 1 G PO CAPS: 2.0000 g | ORAL_CAPSULE | Freq: Two times a day (BID) | ORAL | 11 refills | Status: AC

## 2017-03-09 ENCOUNTER — Other Ambulatory Visit: Payer: Self-pay | Admitting: Medical

## 2017-04-23 ENCOUNTER — Other Ambulatory Visit: Payer: Self-pay | Admitting: Medical

## 2017-04-23 NOTE — Telephone Encounter (Signed)
Can pt have a refill on this 

## 2017-05-09 ENCOUNTER — Other Ambulatory Visit: Payer: Self-pay | Admitting: Medical

## 2017-05-11 ENCOUNTER — Telehealth: Payer: Self-pay | Admitting: Medical

## 2017-05-11 MED ORDER — TESTOSTERONE 20.25 MG/ACT (1.62%) TD GEL: TRANSDERMAL | 0 refills | Status: DC

## 2017-05-11 NOTE — Telephone Encounter (Signed)
Okay 

## 2017-05-11 NOTE — Telephone Encounter (Signed)
Fax refill request from [facility]  Andgrogel 1.62% apply 3 pumps daily

## 2017-05-11 NOTE — Telephone Encounter (Signed)
Done

## 2017-05-21 ENCOUNTER — Encounter: Payer: Self-pay | Admitting: Medical

## 2017-05-21 ENCOUNTER — Ambulatory Visit (INDEPENDENT_AMBULATORY_CARE_PROVIDER_SITE_OTHER): Payer: BLUE CROSS/BLUE SHIELD | Admitting: Medical

## 2017-05-21 VITALS — BP 126/80 | HR 71 | Wt 276.6 lb

## 2017-05-21 DIAGNOSIS — R7989 Other specified abnormal findings of blood chemistry: Secondary | ICD-10-CM | POA: Diagnosis not present

## 2017-05-21 DIAGNOSIS — E291 Testicular hypofunction: Secondary | ICD-10-CM

## 2017-05-21 DIAGNOSIS — Z7189 Other specified counseling: Secondary | ICD-10-CM

## 2017-05-21 DIAGNOSIS — Z7185 Encounter for immunization safety counseling: Secondary | ICD-10-CM

## 2017-05-21 DIAGNOSIS — E781 Pure hyperglyceridemia: Secondary | ICD-10-CM

## 2017-05-21 DIAGNOSIS — K219 Gastro-esophageal reflux disease without esophagitis: Secondary | ICD-10-CM

## 2017-05-21 DIAGNOSIS — Z1211 Encounter for screening for malignant neoplasm of colon: Secondary | ICD-10-CM

## 2017-05-21 DIAGNOSIS — Z79899 Other long term (current) drug therapy: Secondary | ICD-10-CM

## 2017-05-21 NOTE — Patient Instructions (Addendum)
Recommendations:  When you get a chance, get back on the schedule for screening colonoscopy  I recommend you have a shingles vaccine to help prevent shingles or herpes zoster outbreak.   Please call your insurer to inquire about coverage for the Shingrix vaccine given in 2 doses.   Some insurers cover this vaccine after age 51, some cover this after age 71.  If your insurer covers this, then call to schedule appointment to have this vaccine here.

## 2017-05-21 NOTE — Progress Notes (Signed)
Subjective: Chief Complaint  Patient presents with  . med check    med check   Here for routine med check.     Hypogonadism - doing fine on 3 pumps daily Androgel.    No c/o.  Hx/o GERD: Using Prilosec prn, not daily.  Was on schedule for Colonoscopy but mother passed away end of 05/01/23, died of old age.   He plans to reschedule.  He has no other new c/o.  Past Medical History:  Diagnosis Date  . ADHD (attention deficit hyperactivity disorder)    Dr. Evelene Croon  . Anxiety    Dr. Evelene Croon  . Asthma    PFT 04/08/13 normal spirometry flows with bronchodilator response, normal lung volumes and normal diffusion  . GERD (gastroesophageal reflux disease)   . 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 Lumbar degenerative disc disease    Dr. Ethelene Hal, [facility]  . MRSA infection 06/2009   [facility], 28 day hospitalization, started with abrasion to knee  . Rash    intermittent  . Seasonal allergic rhinitis    Current Outpatient Prescriptions on File Prior to Visit  Medication Sig Dispense Refill  . ALPRAZolam (XANAX) 1 MG tablet Take 1 mg by mouth 4 (four) times daily.    . hydrocortisone (PROCTOSOL HC) 2.5 % rectal cream Place 1 application rectally 2 (two) times daily. 30 g 0  . mirtazapine (REMERON) 15 MG tablet Take 1 tablet (15 mg total) by mouth at bedtime. 90 tablet 1  . omega-3 acid ethyl esters (LOVAZA) 1 g capsule Take 2 capsules (2 g total) by mouth 2 (two) times daily. 120 capsule 11  . omeprazole (PRILOSEC) 40 MG capsule Take 1 capsule (40 mg total) by mouth daily. 90 capsule 11  . sildenafil (REVATIO) 20 MG tablet TAKE 1 TO 5 TABLETS PRIOR TO SEXUAL ACTIVITY DAILY AS NEEDED 50 tablet 0  . Testosterone (ANDROGEL PUMP) 20.25 MG/ACT (1.62%) GEL APPLY 3 PUMPS DAILY 75 g 0   No current facility-administered medications on file prior to visit.    ROS as in subjective    Objective: BP 126/80   Pulse 71   Wt 276 lb 9.6 oz (125.5 kg)   SpO2 97%   BMI 35.51 kg/m   General appearance:  alert, no distress, WD/WN,  Neck: supple, no lymphadenopathy, no thyromegaly, no masses, no bruits Heart: RRR, normal S1, S2, no murmurs Lungs: CTA bilaterally, no wheezes, rhonchi, or rales Pulses: 2+ symmetric, upper and lower extremities, normal cap refill Ext: no edema    Assessment: Encounter Diagnoses  Name Primary?  . Low testosterone in male Yes  . Hypogonadism in male   . Gastroesophageal reflux disease without esophagitis   . Hypertriglyceridemia   . High risk medication use   . Vaccine counseling   . Screen for colon cancer      Plan: Low T, hypogonadism - on 3 pumps daily of Androgel.  Lab today. GERD -  Uses prilosec prn, not daily.  Discussed risks/benefits of medication Hypertriglyceridemia - compliant with Lovaza.  Lab today to check efficacy Labs given high risk medication Vaccine counseling done today on flu, shingrix.  He declines flu vaccine.   I recommend you have a shingles vaccine to help prevent shingles or herpes zoster outbreak.   Please call your insurer to inquire about coverage for the Shingrix vaccine given in 2 doses.   Some insurers cover this vaccine after age 71, some cover this after age 40.  If your insurer covers this, then  call to schedule appointment to have this vaccine here. Recommended he get back on schedule for colonoscopy screening.    Expressed sympathy for the death of his mother recently.  Colin Sutton was seen today for med check.  Diagnoses and all orders for this visit:  Low testosterone in male -     Testosterone -     CBC with Differential/Platelet  Hypogonadism in male  Gastroesophageal reflux disease without esophagitis  Hypertriglyceridemia -     Lipid panel  High risk medication use -     Testosterone -     CBC with Differential/Platelet -     Lipid panel  Vaccine counseling  Screen for colon cancer

## 2017-05-22 ENCOUNTER — Other Ambulatory Visit: Payer: Self-pay | Admitting: Medical

## 2017-05-22 DIAGNOSIS — R7989 Other specified abnormal findings of blood chemistry: Secondary | ICD-10-CM

## 2017-05-22 LAB — CBC WITH DIFFERENTIAL/PLATELET
BASOS ABS: 100 {cells}/uL (ref 0–200)
Basophils Relative: 1.7 %
Eosinophils Absolute: 313 cells/uL (ref 15–500)
Eosinophils Relative: 5.3 %
HEMATOCRIT: 47.9 % (ref 38.5–50.0)
Hemoglobin: 16.4 g/dL (ref 13.2–17.1)
LYMPHS ABS: 1959 {cells}/uL (ref 850–3900)
MCH: 28 pg (ref 27.0–33.0)
MCHC: 34.2 g/dL (ref 32.0–36.0)
MCV: 81.9 fL (ref 80.0–100.0)
MPV: 11.7 fL (ref 7.5–12.5)
Monocytes Relative: 11.1 %
NEUTROS PCT: 48.7 %
Neutro Abs: 2873 cells/uL (ref 1500–7800)
Platelets: 242 10*3/uL (ref 140–400)
RBC: 5.85 10*6/uL — AB (ref 4.20–5.80)
RDW: 13.1 % (ref 11.0–15.0)
Total Lymphocyte: 33.2 %
WBC: 5.9 10*3/uL (ref 3.8–10.8)
WBCMIX: 655 {cells}/uL (ref 200–950)

## 2017-05-22 LAB — LIPID PANEL
CHOLESTEROL: 149 mg/dL (ref <200)
HDL: 42 mg/dL (ref >40)
LDL Cholesterol (Calc): 87 mg/dL (calc)
Non-HDL Cholesterol (Calc): 107 mg/dL (calc) (ref <130)
Total CHOL/HDL Ratio: 3.5 (calc) (ref <5.0)
Triglycerides: 105 mg/dL (ref <150)

## 2017-05-22 LAB — TESTOSTERONE: TESTOSTERONE: 1095 ng/dL — AB (ref 250–827)

## 2017-05-22 MED ORDER — TESTOSTERONE 20.25 MG/ACT (1.62%) TD GEL: TRANSDERMAL | 2 refills | Status: DC

## 2017-06-07 ENCOUNTER — Other Ambulatory Visit: Payer: Self-pay | Admitting: Family Medicine

## 2017-06-07 DIAGNOSIS — R7989 Other specified abnormal findings of blood chemistry: Secondary | ICD-10-CM

## 2017-06-07 NOTE — Telephone Encounter (Signed)
This shouldn't need refill as we just called in after last appt.   So call and verify though both refills and dose.   Per last visit where testosterone level was too high, he should currently be using Androgel 1.5 pumps each arm daily or 3 pumps daily EXCEPT 1 pump each arm Saturday and Sunday.     I thought we called this out for refill date of 06/14/17 with 2 refills.

## 2017-06-07 NOTE — Telephone Encounter (Signed)
Yours

## 2017-06-07 NOTE — Telephone Encounter (Signed)
Is this ok to refill?  

## 2017-06-09 ENCOUNTER — Other Ambulatory Visit: Payer: Self-pay | Admitting: Medical

## 2017-06-11 ENCOUNTER — Other Ambulatory Visit: Payer: Self-pay

## 2017-06-11 NOTE — Telephone Encounter (Signed)
Can pt have a refill on this 

## 2017-06-26 ENCOUNTER — Other Ambulatory Visit: Payer: Self-pay | Admitting: Medical

## 2017-06-26 DIAGNOSIS — R7989 Other specified abnormal findings of blood chemistry: Secondary | ICD-10-CM

## 2017-06-26 NOTE — Telephone Encounter (Signed)
Ok to renew?  

## 2017-06-27 NOTE — Telephone Encounter (Signed)
Call it out 

## 2017-08-03 ENCOUNTER — Other Ambulatory Visit: Payer: Self-pay | Admitting: Medical

## 2017-08-23 ENCOUNTER — Encounter: Payer: Self-pay | Admitting: Medical

## 2017-08-23 ENCOUNTER — Ambulatory Visit: Payer: BLUE CROSS/BLUE SHIELD | Admitting: Medical

## 2017-08-23 VITALS — BP 124/88 | HR 94 | Wt 272.4 lb

## 2017-08-23 DIAGNOSIS — E781 Pure hyperglyceridemia: Secondary | ICD-10-CM | POA: Diagnosis not present

## 2017-08-23 DIAGNOSIS — M51369 Other intervertebral disc degeneration, lumbar region without mention of lumbar back pain or lower extremity pain: Secondary | ICD-10-CM

## 2017-08-23 DIAGNOSIS — R7989 Other specified abnormal findings of blood chemistry: Secondary | ICD-10-CM | POA: Diagnosis not present

## 2017-08-23 DIAGNOSIS — Z79899 Other long term (current) drug therapy: Secondary | ICD-10-CM

## 2017-08-23 DIAGNOSIS — M5136 Other intervertebral disc degeneration, lumbar region: Secondary | ICD-10-CM | POA: Diagnosis not present

## 2017-08-23 DIAGNOSIS — Z1211 Encounter for screening for malignant neoplasm of colon: Secondary | ICD-10-CM

## 2017-08-23 NOTE — Patient Instructions (Signed)
Check insurance coverage for Cologuard test   Return in 6 months for a PHYSICAL visit/preventative care visit and fasting labs

## 2017-08-23 NOTE — Progress Notes (Signed)
Subjective: Chief Complaint  Patient presents with  . med check    med check    Here for recheck on low testosterone.  He is using 3 pumps daily but sometimes doesn't use it on the days he doesn't work.  Lately it doesn't seem to be as strong as he thinks it should be.     He didn't get around to going for colonoscopy consult this past year given death of mother.    He found out before mom died that the man that raised him was not his biological father.   (we had in his family health history that his father had heart disease, but that was actually his non-biological father.   His biological father that he did meet recently is a large active man that does not have any specific medical problems.    No other aggravating or relieving factors. No other complaint.  Past Medical History:  Diagnosis Date  . ADHD (attention deficit hyperactivity disorder)    Dr. Evelene Croon  . Anxiety    Dr. Evelene Croon  . Asthma    PFT 04/08/13 normal spirometry flows with bronchodilator response, normal lung volumes and normal diffusion  . GERD (gastroesophageal reflux disease)   . 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 Lumbar degenerative disc disease    Dr. Ethelene Hal, [facility]  . MRSA infection 06/2009   [facility], 28 day hospitalization, started with abrasion to knee  . Rash    intermittent  . Seasonal allergic rhinitis    Current Outpatient Medications on File Prior to Visit  Medication Sig Dispense Refill  . ALPRAZolam (XANAX) 1 MG tablet Take 1 mg by mouth 4 (four) times daily.    . ANDROGEL PUMP 20.25 MG/ACT (1.62%) GEL APPLY 3 PUMPS DAILY 75 g 2  . dextroamphetamine (DEXEDRINE SPANSULE) 10 MG 24 hr capsule   0  . mirtazapine (REMERON) 15 MG tablet Take 1 tablet (15 mg total) by mouth at bedtime. 90 tablet 1  . omega-3 acid ethyl esters (LOVAZA) 1 g capsule Take 2 capsules (2 g total) by mouth 2 (two) times daily. 120 capsule 11  . omeprazole (PRILOSEC) 40 MG capsule Take 1 capsule (40 mg total) by mouth daily.  90 capsule 11  . oxycodone (ROXICODONE) 30 MG immediate release tablet   0  . sildenafil (REVATIO) 20 MG tablet TAKE 1 TO 5 TABLETS PRIOR TO SEXUAL ACTIVITY DAILY AS NEEDED 50 tablet 1   No current facility-administered medications on file prior to visit.    ROS as in subjective   Objective: BP 124/88   Pulse 94   Wt 272 lb 6.4 oz (123.6 kg)   SpO2 98%   BMI 34.97 kg/m   General appearance: alert, no distress, WD/WN,  Neck: supple, no lymphadenopathy, no thyromegaly, no masses Heart: RRR, normal S1, S2, no murmurs Lungs: CTA bilaterally, no wheezes, rhonchi, or rales Abdomen: +bs, soft, non tender, non distended, no masses, no hepatomegaly, no splenomegaly Pulses: 2+ symmetric, upper and lower extremities, normal cap refill Ext: no edema    Assessment: Encounter Diagnoses  Name Primary?  . Low testosterone in male Yes  . Hypertriglyceridemia   . Screen for colon cancer   . DDD (degenerative disc disease), lumbar   . High risk medication use     Plan: Low testosterone - repeat lab today, counseled on proper use of medication.   F/u pending labs  hypertriglyceridemia - labs much improved from last labs in 05/2017.  Counseled again on colon cancer  screening.   He wants to check insurance coverage for Cologuard.  DDD - of note, he sees ortho, uses oxycodone, but recently his relative got him some Pen[place]aid topical NSAID from [place] for $40 in leiu of the fact that his insurance wouldn't cover this at all.  He notes the topical NSAID works great compared to anything other treatments including oxycodone  Of note, he has multiple providers, is on controlled substances.    F/u 76mo for fasting physical visit including EKG and prostate check as we discussed today  Earl LitesGregory was seen today for med check.  Diagnoses and all orders for this visit:  Low testosterone in male -     Testosterone  Hypertriglyceridemia  Screen for colon cancer  DDD (degenerative disc disease),  lumbar  High risk medication use

## 2017-08-24 LAB — TESTOSTERONE: Testosterone: 170 ng/dL — ABNORMAL LOW (ref 250–827)

## 2017-09-26 ENCOUNTER — Other Ambulatory Visit: Payer: Self-pay | Admitting: Medical

## 2017-09-26 DIAGNOSIS — R7989 Other specified abnormal findings of blood chemistry: Secondary | ICD-10-CM

## 2017-10-25 ENCOUNTER — Other Ambulatory Visit: Payer: Self-pay | Admitting: Medical

## 2017-10-25 ENCOUNTER — Telehealth: Payer: Self-pay | Admitting: Medical

## 2017-10-25 NOTE — Telephone Encounter (Signed)
Please call Dr. Milagros Evenerupinder Kaur office.  I on several occasions have received notice from his insurer about concern for overuse of controlled substances.  Most of his controlled substances other than testosterone are being prescribed by Dr. Evelene CroonKaur.   I wanted to make sure they are getting these messages from the insurer as well in case there is something they need to address with him

## 2017-10-25 NOTE — Telephone Encounter (Signed)
Can pt have a refill on meds  

## 2017-10-29 NOTE — Telephone Encounter (Signed)
Called and spoke to Dr. Evelene CroonKaur office and it looks like they have gotten the notice too, she was going to let Dr. Evelene CroonKaur know again about this and if there was any questions she could call and speak to you about this to see who needs to address this with pt.

## 2017-11-28 ENCOUNTER — Other Ambulatory Visit: Payer: Self-pay | Admitting: Medical

## 2017-11-28 DIAGNOSIS — R7989 Other specified abnormal findings of blood chemistry: Secondary | ICD-10-CM

## 2017-11-28 NOTE — Telephone Encounter (Signed)
Can you refill while Vincenza HewsShane is out? LOV- 08/2017 Thanks!

## 2018-01-23 ENCOUNTER — Other Ambulatory Visit: Payer: Self-pay | Admitting: Family Medicine

## 2018-01-23 DIAGNOSIS — R7989 Other specified abnormal findings of blood chemistry: Secondary | ICD-10-CM

## 2018-01-23 NOTE — Telephone Encounter (Signed)
Your patient 

## 2018-01-23 NOTE — Telephone Encounter (Signed)
[facility] gardiner id requesting to fill pt testoterone. [facility]

## 2018-01-23 NOTE — Telephone Encounter (Signed)
Colin Sutton is reqesting to fill pt testosterone. Please advise [name]

## 2018-01-24 ENCOUNTER — Telehealth: Payer: Self-pay | Admitting: Medical

## 2018-01-24 NOTE — Telephone Encounter (Signed)
Pt called and is requesting a refill on his testosterone pt uses [facility] - [place] - [address] pt can be reached at [phone] pt is going out of town and would like this sent today

## 2018-03-16 ENCOUNTER — Other Ambulatory Visit: Payer: Self-pay | Admitting: Medical

## 2018-03-16 DIAGNOSIS — R7989 Other specified abnormal findings of blood chemistry: Secondary | ICD-10-CM

## 2018-03-18 ENCOUNTER — Other Ambulatory Visit: Payer: Self-pay

## 2018-03-18 DIAGNOSIS — E291 Testicular hypofunction: Secondary | ICD-10-CM

## 2018-03-18 NOTE — Telephone Encounter (Signed)
He needs to come in for testosterone level

## 2018-03-18 NOTE — Telephone Encounter (Signed)
Is this ok to refill?  

## 2018-03-18 NOTE — Telephone Encounter (Signed)
Pt coming in for labs Monday. kH

## 2018-03-25 ENCOUNTER — Other Ambulatory Visit: Payer: Self-pay | Admitting: Medical

## 2018-03-25 ENCOUNTER — Other Ambulatory Visit: Payer: Self-pay

## 2018-03-26 ENCOUNTER — Other Ambulatory Visit: Payer: Self-pay

## 2018-05-06 ENCOUNTER — Telehealth: Payer: Self-pay | Admitting: Medical

## 2018-05-06 NOTE — Telephone Encounter (Signed)
Pt called and stated he was to come in to have testosterone check in August and was unable to make that appt due to his father becoming very ill. He had to go to [place] to care for him. He is now out of testosterone and knows that if he comes in for labs now his levels will not be correct. Please advise pt at [phone] and pt uses [facility].

## 2018-05-06 NOTE — Telephone Encounter (Signed)
Call pharmacy and see if there has been any unusual activity or refill request on his testosterone?  Any red flags for medication diversion?  And see if they happen to have [place] reporting system recent review on him?

## 2018-05-07 ENCOUNTER — Other Ambulatory Visit: Payer: Self-pay | Admitting: Medical

## 2018-05-07 DIAGNOSIS — R7989 Other specified abnormal findings of blood chemistry: Secondary | ICD-10-CM

## 2018-05-07 MED ORDER — TESTOSTERONE 20.25 MG/ACT (1.62%) TD GEL
TRANSDERMAL | 0 refills | Status: DC
Start: 2018-05-07 — End: 2018-06-19

## 2018-05-07 NOTE — Telephone Encounter (Signed)
Per pharmacy patient got refilled on 03-18-18 and 02-22-18 before that.  No unusual activity noticed

## 2018-05-23 ENCOUNTER — Other Ambulatory Visit: Payer: BLUE CROSS/BLUE SHIELD

## 2018-06-14 ENCOUNTER — Telehealth: Payer: Self-pay

## 2018-06-14 ENCOUNTER — Other Ambulatory Visit: Payer: BLUE CROSS/BLUE SHIELD

## 2018-06-14 DIAGNOSIS — E291 Testicular hypofunction: Secondary | ICD-10-CM

## 2018-06-14 NOTE — Telephone Encounter (Signed)
Patient needs refill on testosterone sent to pharmacy.

## 2018-06-14 NOTE — Telephone Encounter (Signed)
He is due for a physical.  Please schedule him for fasting physical.  You can call in 30-day supply only of testosterone

## 2018-06-15 LAB — TESTOSTERONE: Testosterone: 7 ng/dL — ABNORMAL LOW (ref 264–916)

## 2018-06-19 ENCOUNTER — Other Ambulatory Visit: Payer: Self-pay | Admitting: Medical

## 2018-06-19 DIAGNOSIS — R7989 Other specified abnormal findings of blood chemistry: Secondary | ICD-10-CM

## 2018-06-19 MED ORDER — TESTOSTERONE 20.25 MG/ACT (1.62%) TD GEL: TRANSDERMAL | 0 refills | Status: DC

## 2018-06-19 NOTE — Telephone Encounter (Signed)
Is this ok to refill?  

## 2018-07-15 ENCOUNTER — Encounter: Payer: Self-pay | Admitting: Medical

## 2018-07-15 ENCOUNTER — Ambulatory Visit: Payer: BLUE CROSS/BLUE SHIELD | Admitting: Medical

## 2018-07-15 VITALS — BP 120/82 | HR 88 | Temp 97.8°F | Resp 16 | Ht 76.0 in | Wt 275.0 lb

## 2018-07-15 DIAGNOSIS — E291 Testicular hypofunction: Secondary | ICD-10-CM | POA: Diagnosis not present

## 2018-07-15 DIAGNOSIS — Z79899 Other long term (current) drug therapy: Secondary | ICD-10-CM

## 2018-07-15 DIAGNOSIS — Z125 Encounter for screening for malignant neoplasm of prostate: Secondary | ICD-10-CM | POA: Diagnosis not present

## 2018-07-15 DIAGNOSIS — R7989 Other specified abnormal findings of blood chemistry: Secondary | ICD-10-CM

## 2018-07-15 DIAGNOSIS — Z1211 Encounter for screening for malignant neoplasm of colon: Secondary | ICD-10-CM

## 2018-07-15 MED ORDER — TESTOSTERONE 20.25 MG/ACT (1.62%) TD GEL: TRANSDERMAL | 2 refills | Status: DC

## 2018-07-15 NOTE — Progress Notes (Signed)
Subjective: Chief Complaint  Patient presents with  . med check    med check    Here for med check.  Low testosterone, hypogonadism- does fine on AndroGel but for several months now he is only been able to get 1 pump bottle per month order he uses more fluid in this.  Uses Androgel 3 pumps daily or 1.5 pumps daily.  He runs out early each month since he is using 1.5 pumps each side daily.  He is also been out of town from [place] visiting family on all the last several months so has not been getting refills exactly every 30 days.  He still has not done colon cancer screening.  I called the pharmacy to verify refill dates and pickup dates.  The dates he actually got the medication from the pharmacy include the following:  02/22/18, 03/18/18, 05/08/18, 07/02/18    Past Medical History:  Diagnosis Date  . ADHD (attention deficit hyperactivity disorder)    Dr. Evelene CroonKaur  . Anxiety    Dr. Evelene CroonKaur  . Asthma    PFT 04/08/13 normal spirometry flows with bronchodilator response, normal lung volumes and normal diffusion  . GERD (gastroesophageal reflux disease)   . 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 Lumbar degenerative disc disease    [facility]  . MRSA infection 06/2009   [facility], [facility] day hospitalization, started with abrasion to knee  . Rash    intermittent  . Seasonal allergic rhinitis    Current Outpatient Medications on File Prior to Visit  Medication Sig Dispense Refill  . ALPRAZolam (XANAX) 1 MG tablet Take 1 mg by mouth 4 (four) times daily.    Marland Kitchen. dextroamphetamine (DEXEDRINE SPANSULE) 10 MG 24 hr capsule   0  . omega-3 acid ethyl esters (LOVAZA) 1 g capsule Take 2 capsules (2 g total) by mouth 2 (two) times daily. 120 capsule 11  . omeprazole (PRILOSEC) 40 MG capsule Take 1 capsule (40 mg total) by mouth daily. 90 capsule 11  . oxycodone (ROXICODONE) 30 MG immediate release tablet   0  . sildenafil (REVATIO) 20 MG tablet TAKE 1 TO 5 TABLETS PRIOR TO SEXUAL ACTIVITY DAILY AS  NEEDED 50 tablet 2  . mirtazapine (REMERON) 15 MG tablet Take 1 tablet (15 mg total) by mouth at bedtime. (Patient not taking: Reported on 07/15/2018) 90 tablet 1   No current facility-administered medications on file prior to visit.     Family History  Problem Relation Age of Onset  . COPD Mother   . Diabetes Mother   . Asthma Brother   . Cancer Neg Hx   . Stroke Neg Hx     ROS as in subjective   Objective: BP 120/82   Pulse 88   Temp 97.8 F (36.6 C) (Oral)   Resp 16   Ht 6\' 4"  (1.93 m)   Wt 275 lb (124.7 kg)   SpO2 97%   BMI 33.47 kg/m   Wt Readings from Last 3 Encounters:  07/15/18 275 lb (124.7 kg)  08/23/17 272 lb 6.4 oz (123.6 kg)  05/21/17 276 lb 9.6 oz (125.5 kg)   General appearance: alert, no distress, WD/WN,  Neck: supple, no lymphadenopathy, no thyromegaly, no masses Heart: RRR, normal S1, S2, no murmurs Lungs: CTA bilaterally, no wheezes, rhonchi, or rales Abdomen: +bs, soft, non tender, non distended, no masses, no hepatomegaly, no splenomegaly Pulses: 2+ symmetric, upper and lower extremities, normal cap refill Ext: no edema GU/rectal - deferred/declined  Assessment: Encounter Diagnoses  Name Primary?  . Hypogonadism in male Yes  . Low testosterone in male   . High risk medication use   . Screening for prostate cancer   . Screen for colon cancer      Plan: Low testosterone, hypogonadism- continue AndroGel 1.5 pumps each side daily, discussed proper use, refilled with modified instructions hopefully give him adequate amount of medication for month  He will return in 1 month for labs including routine surveillance labs  Referral for Cologuard colon cancer screening  We discussed risk and benefits of prostate cancer screening, PSA lab today  Rajveer was seen today for med check.  Diagnoses and all orders for this visit:  Hypogonadism in male -     PSA; Future -     Testosterone; Future  Low testosterone in male -      Testosterone 20.25 MG/ACT (1.62%) GEL; Apply 3 pumps daily, 1.5 pump each arm -     PSA; Future -     Testosterone; Future  High risk medication use -     PSA; Future -     Lipid panel; Future -     CBC; Future -     Testosterone; Future  Screening for prostate cancer -     PSA; Future  Screen for colon cancer -     Cologuard

## 2018-09-23 ENCOUNTER — Encounter: Payer: Self-pay | Admitting: Medical

## 2018-10-17 ENCOUNTER — Other Ambulatory Visit: Payer: Self-pay | Admitting: Medical

## 2018-10-17 ENCOUNTER — Telehealth: Payer: Self-pay | Admitting: Medical

## 2018-10-17 DIAGNOSIS — R7989 Other specified abnormal findings of blood chemistry: Secondary | ICD-10-CM

## 2018-10-17 MED ORDER — TESTOSTERONE 20.25 MG/ACT (1.62%) TD GEL: TRANSDERMAL | 0 refills | Status: DC

## 2018-10-17 NOTE — Telephone Encounter (Signed)
Pt called and states he is in [place] taking care of his father and will be there until the end of the month.  [facility] to [facility] and they are shipping his meds to him.  He will schedule an appt when he returns.

## 2018-12-31 ENCOUNTER — Other Ambulatory Visit: Payer: Self-pay | Admitting: Medical

## 2018-12-31 DIAGNOSIS — R7989 Other specified abnormal findings of blood chemistry: Secondary | ICD-10-CM

## 2019-01-01 NOTE — Telephone Encounter (Signed)
Is this ok to refill?  

## 2019-02-05 ENCOUNTER — Other Ambulatory Visit: Payer: Self-pay | Admitting: Medical

## 2019-02-05 DIAGNOSIS — R7989 Other specified abnormal findings of blood chemistry: Secondary | ICD-10-CM

## 2019-02-05 MED ORDER — TESTOSTERONE 20.25 MG/ACT (1.62%) TD GEL: TRANSDERMAL | 0 refills | Status: DC

## 2019-02-05 NOTE — Telephone Encounter (Signed)
I will need to go ahead and have him come in for a complete physical and fasting labs.  #1 labs will be cheaper if he comes in for a physical as labs are normally cheaper and billed as part of a physical.  #2 he has not had labs since 2018 for prostate and routine preventative care.  Testosterone therapy requires screening for prostate at least once yearly and his last one was 2 years ago.  Thus, I can only give 30-day supply until he comes in for this

## 2019-02-05 NOTE — Telephone Encounter (Signed)
See my prior msg 

## 2019-02-05 NOTE — Telephone Encounter (Signed)
Is this ok to refill?  

## 2019-02-06 NOTE — Telephone Encounter (Signed)
Left message on voicemail for patient to call back to schedule CPE  

## 2019-03-05 ENCOUNTER — Encounter: Payer: BLUE CROSS/BLUE SHIELD | Admitting: Medical

## 2019-03-25 ENCOUNTER — Other Ambulatory Visit: Payer: Self-pay | Admitting: Medical

## 2019-03-25 DIAGNOSIS — R7989 Other specified abnormal findings of blood chemistry: Secondary | ICD-10-CM

## 2019-03-25 NOTE — Telephone Encounter (Signed)
Spoke to pt who just lost his father . He will call when he comes in town to make a med check appt. [place]

## 2019-03-25 NOTE — Telephone Encounter (Signed)
He has not been seen in quite some time and therefore needs an appointment.  Do not let him run out of his medication.

## 2019-03-25 NOTE — Telephone Encounter (Signed)
[facility] gard[facility] is requesting to fill pt testosterone. This a shane pt .Please advise [facility]

## 2019-04-23 ENCOUNTER — Other Ambulatory Visit: Payer: Self-pay | Admitting: Medical

## 2019-05-06 ENCOUNTER — Other Ambulatory Visit: Payer: BLUE CROSS/BLUE SHIELD

## 2019-05-06 ENCOUNTER — Other Ambulatory Visit: Payer: Self-pay

## 2019-05-06 DIAGNOSIS — R7989 Other specified abnormal findings of blood chemistry: Secondary | ICD-10-CM

## 2019-05-06 DIAGNOSIS — Z79899 Other long term (current) drug therapy: Secondary | ICD-10-CM

## 2019-05-06 DIAGNOSIS — E291 Testicular hypofunction: Secondary | ICD-10-CM

## 2019-05-06 DIAGNOSIS — Z125 Encounter for screening for malignant neoplasm of prostate: Secondary | ICD-10-CM

## 2019-05-07 LAB — LIPID PANEL
Chol/HDL Ratio: 4.2 ratio (ref 0.0–5.0)
Cholesterol, Total: 158 mg/dL (ref 100–199)
HDL: 38 mg/dL — ABNORMAL LOW (ref >39)
LDL Chol Calc (NIH): 99 mg/dL (ref 0–99)
Triglycerides: 116 mg/dL (ref 0–149)
VLDL Cholesterol Cal: 21 mg/dL (ref 5–40)

## 2019-05-07 LAB — CBC
Hematocrit: 48.5 % (ref 37.5–51.0)
Hemoglobin: 16.3 g/dL (ref 13.0–17.7)
MCH: 27.5 pg (ref 26.6–33.0)
MCHC: 33.6 g/dL (ref 31.5–35.7)
MCV: 82 fL (ref 79–97)
Platelets: 309 10*3/uL (ref 150–450)
RBC: 5.93 x10E6/uL — ABNORMAL HIGH (ref 4.14–5.80)
RDW: 13.7 % (ref 11.6–15.4)
WBC: 6.3 10*3/uL (ref 3.4–10.8)

## 2019-05-07 LAB — TESTOSTERONE: Testosterone: 765 ng/dL (ref 264–916)

## 2019-05-07 LAB — PSA: Prostate Specific Ag, Serum: 0.4 ng/mL (ref 0.0–4.0)

## 2019-05-08 ENCOUNTER — Other Ambulatory Visit: Payer: Self-pay | Admitting: Medical

## 2019-05-08 DIAGNOSIS — R7989 Other specified abnormal findings of blood chemistry: Secondary | ICD-10-CM

## 2019-05-08 MED ORDER — TESTOSTERONE 20.25 MG/ACT (1.62%) TD GEL: TRANSDERMAL | 1 refills | Status: DC

## 2019-07-01 ENCOUNTER — Other Ambulatory Visit: Payer: Self-pay | Admitting: Medical

## 2019-07-23 ENCOUNTER — Encounter: Payer: BLUE CROSS/BLUE SHIELD | Admitting: Medical

## 2019-07-29 ENCOUNTER — Other Ambulatory Visit: Payer: Self-pay | Admitting: Medical

## 2019-07-29 DIAGNOSIS — R7989 Other specified abnormal findings of blood chemistry: Secondary | ICD-10-CM

## 2019-10-01 ENCOUNTER — Other Ambulatory Visit: Payer: Self-pay | Admitting: Medical

## 2019-10-01 DIAGNOSIS — R7989 Other specified abnormal findings of blood chemistry: Secondary | ICD-10-CM

## 2020-09-14 DEATH — deceased
